# Patient Record
Sex: Female | Born: 1960 | Race: Black or African American | Hispanic: No | Marital: Single | Smoking: Never smoker
Health system: Southern US, Community
[De-identification: ages and names within clinical notes are randomized; demographics above are authoritative.]

## PROBLEM LIST (undated history)

## (undated) DIAGNOSIS — R569 Unspecified convulsions: Secondary | ICD-10-CM

## (undated) DIAGNOSIS — E785 Hyperlipidemia, unspecified: Secondary | ICD-10-CM

## (undated) DIAGNOSIS — E875 Hyperkalemia: Secondary | ICD-10-CM

## (undated) DIAGNOSIS — E559 Vitamin D deficiency, unspecified: Secondary | ICD-10-CM

## (undated) DIAGNOSIS — I10 Essential (primary) hypertension: Secondary | ICD-10-CM

## (undated) HISTORY — PX: COLONOSCOPY: SHX174

## (undated) HISTORY — DX: Vitamin D deficiency, unspecified: E55.9

## (undated) HISTORY — DX: Hyperkalemia: E87.5

## (undated) HISTORY — DX: Hyperlipidemia, unspecified: E78.5

---

## 2005-02-20 ENCOUNTER — Emergency Department (HOSPITAL_COMMUNITY): Admission: EM | Admit: 2005-02-20 | Discharge: 2005-02-20 | Payer: Self-pay | Admitting: Emergency Medicine

## 2014-11-19 ENCOUNTER — Encounter (HOSPITAL_BASED_OUTPATIENT_CLINIC_OR_DEPARTMENT_OTHER): Payer: Self-pay

## 2014-11-19 ENCOUNTER — Emergency Department (HOSPITAL_BASED_OUTPATIENT_CLINIC_OR_DEPARTMENT_OTHER)
Admission: EM | Admit: 2014-11-19 | Discharge: 2014-11-19 | Disposition: A | Discharge status: Home / Self Care | Payer: Worker's Compensation | Attending: Emergency Medicine | Admitting: Emergency Medicine

## 2014-11-19 DIAGNOSIS — Y9389 Activity, other specified: Secondary | ICD-10-CM | POA: Insufficient documentation

## 2014-11-19 DIAGNOSIS — M25512 Pain in left shoulder: Secondary | ICD-10-CM

## 2014-11-19 DIAGNOSIS — M779 Enthesopathy, unspecified: Secondary | ICD-10-CM | POA: Insufficient documentation

## 2014-11-19 DIAGNOSIS — S4992XA Unspecified injury of left shoulder and upper arm, initial encounter: Secondary | ICD-10-CM | POA: Diagnosis present

## 2014-11-19 DIAGNOSIS — Y9289 Other specified places as the place of occurrence of the external cause: Secondary | ICD-10-CM | POA: Diagnosis not present

## 2014-11-19 DIAGNOSIS — M778 Other enthesopathies, not elsewhere classified: Secondary | ICD-10-CM

## 2014-11-19 DIAGNOSIS — X58XXXA Exposure to other specified factors, initial encounter: Secondary | ICD-10-CM | POA: Insufficient documentation

## 2014-11-19 DIAGNOSIS — Y990 Civilian activity done for income or pay: Secondary | ICD-10-CM | POA: Diagnosis not present

## 2014-11-19 DIAGNOSIS — M7582 Other shoulder lesions, left shoulder: Secondary | ICD-10-CM

## 2014-11-19 DIAGNOSIS — S46912A Strain of unspecified muscle, fascia and tendon at shoulder and upper arm level, left arm, initial encounter: Secondary | ICD-10-CM | POA: Insufficient documentation

## 2014-11-19 MED ORDER — NAPROXEN 250 MG PO TABS
500.0000 mg | ORAL_TABLET | Freq: Once | ORAL | Status: AC
  Administered 2014-11-19: 500 mg via ORAL
  Filled 2014-11-19: qty 2

## 2014-11-19 MED ORDER — NAPROXEN 500 MG PO TABS: 500.0000 mg | ORAL_TABLET | Freq: Two times a day (BID) | ORAL | Status: DC | PRN

## 2014-11-19 NOTE — ED Provider Notes (Signed)
CSN: [id number]     Arrival date & time 11/19/14  1412 History   None    Chief Complaint  Patient presents with  . Shoulder Injury     (Consider location/radiation/quality/duration/timing/severity/associated sxs/prior Treatment) HPI Comments: Shari Gonzales is a 54 y.o. female who presents to the ED with complaints of left shoulder injury 3 days ago. Patient reports that she works at [employer] and she was pulling a wet sheet from the washing machine when she developed left shoulder pain. She describes her pain as 5/10 intermittent aching nonradiating pain worse with movement of the shoulder and relieved with applying pressure under her armpit. She denies any fevers, chills, chest pain, shortness of breath, abdominal pain, nausea, vomiting, dysuria, hematuria, numbness, tingling, weakness, swelling, bruises, or abrasions. She denies any prior injuries to the shoulder.  Patient is a 54 y.o. female presenting with shoulder injury. The history is provided by the patient. No language interpreter was used.  Shoulder Injury This is a new problem. The current episode started in the past 7 days. The problem occurs constantly. The problem has been unchanged. Associated symptoms include arthralgias (L shoulder). Pertinent negatives include no abdominal pain, chest pain, chills, fever, myalgias, nausea, numbness, vomiting or weakness. Exacerbated by: movement of shoulder. Treatments tried: holding pressure under armpit. The treatment provided mild relief.    History reviewed. No pertinent past medical history. History reviewed. No pertinent past surgical history. No family history on file. Social History  Substance Use Topics  . Smoking status: Never Smoker   . Smokeless tobacco: None  . Alcohol Use: No   OB History    No data available     Review of Systems  Constitutional: Negative for fever and chills.  Respiratory: Negative for shortness of breath.   Cardiovascular: Negative for chest pain.    Gastrointestinal: Negative for nausea, vomiting and abdominal pain.  Genitourinary: Negative for dysuria and hematuria.  Musculoskeletal: Positive for arthralgias (L shoulder). Negative for myalgias.  Skin: Negative for color change and wound.  Allergic/Immunologic: Negative for immunocompromised state.  Neurological: Negative for weakness and numbness.  Psychiatric/Behavioral: Negative for confusion.   10 Systems reviewed and are negative for acute change except as noted in the HPI.    Allergies  Review of patient's allergies indicates not on file.  Home Medications   Prior to Admission medications   Not on File   Triage VS: BP 184/101 mmHg  Pulse 76  Temp(Src) 97.9 F (36.6 C) (Oral)  Resp 18  Ht  (1.626 m)  Wt 166 lb 4.8 oz (75.433 kg)  BMI 28.53 kg/m2  SpO2 100% Exam VS: BP 143/90 mmHg  Pulse 75  Temp(Src) 97.9 F (36.6 C) (Oral)  Resp 18  Ht  (1.626 m)  Wt 166 lb 4.8 oz (75.433 kg)  BMI 28.53 kg/m2  SpO2 100%  Physical Exam  Constitutional: She is oriented to person, place, and time. Vital signs are normal. She appears well-developed and well-nourished.  Non-toxic appearance. No distress.  Afebrile, nontoxic, NAD  HENT:  Head: Normocephalic and atraumatic.  Mouth/Throat: Mucous membranes are normal.  Eyes: Conjunctivae and EOM are normal. Right eye exhibits no discharge. Left eye exhibits no discharge.  Neck: Normal range of motion. Neck supple.  Cardiovascular: Normal rate and intact distal pulses.   Pulmonary/Chest: Effort normal. No respiratory distress.  Abdominal: Normal appearance. She exhibits no distension.  Musculoskeletal:       Left shoulder: She exhibits decreased range of motion (due to  pain), tenderness and spasm. She exhibits no bony tenderness, no swelling, no effusion, no crepitus, no deformity, normal pulse and normal strength.       Arms: L shoulder with limited ROM due to pain, no bony TTP, with diffuse muscular TTP and spasms,  no swelling/effusion, no crepitus/deformity, +apley scratch, +pain with resisted int rotation, no pain with resisted ext rotation, +empty can test. Strength and sensation grossly intact in all extremities, distal pulses intact.    Neurological: She is alert and oriented to person, place, and time. She has normal strength. No sensory deficit.  Skin: Skin is warm, dry and intact. No rash noted.  Psychiatric: She has a normal mood and affect. Her behavior is normal.  Nursing note and vitals reviewed.   ED Course  Procedures (including critical care time) Labs Review Labs Reviewed - No data to display  Imaging Review No results found. I have personally reviewed and evaluated these images and lab results as part of my medical decision-making.   EKG Interpretation None      MDM   Final diagnoses:  Left shoulder strain, initial encounter  Left shoulder pain  Shoulder tendinitis, left    54 y.o. female here with L shoulder strain after pulling wet sheet from washing machine last weekend. All extremities NVI with soft compartments, L shoulder with tenderness along muscles of posterior shoulder, no focal bony tenderness. Doubt need for xray imaging, likely rotator cuff tendinitis/strain. Will give sling, discussed proper use for no more than 3 days, then ROM exercises thereafter. Will give naprosyn. Discussed heat/ice/tylenol use. Will have her f/up with ortho in 1-2wks. I explained the diagnosis and have given explicit precautions to return to the ER including for any other new or worsening symptoms. The patient understands and accepts the medical plan as it's been dictated and I have answered their questions. Discharge instructions concerning home care and prescriptions have been given. The patient is STABLE and is discharged to home in good condition.  BP 143/90 mmHg  Pulse 75  Temp(Src) 97.9 F (36.6 C) (Oral)  Resp 18  Ht 5\' 4"  (1.626 m)  Wt 166 lb 4.8 oz (75.433 kg)  BMI 28.53 kg/m2   SpO2 100%  Meds ordered this encounter  Medications  . naproxen (NAPROSYN) tablet 500 mg    Sig:   . naproxen (NAPROSYN) 500 MG tablet    Sig: Take 1 tablet (500 mg total) by mouth 2 (two) times daily as needed for mild pain, moderate pain or headache (TAKE WITH MEALS.).    Dispense:  20 tablet    Refill:  0    Order Specific Question:  Supervising Provider    Answer:  Eber HongMILLER, BRIAN [[id number]]       Manjot Hinks Camprubi-Soms, PA-C 11/19/14 1523  Mirian MoMatthew Gentry, MD 11/22/14 1012

## 2014-11-19 NOTE — Discharge Instructions (Signed)
Wear shoulder sling for no more than 3 days, then begin performing gentle range of motion exercises. Use heat on your shoulder throughout the day, using a heat pack for 20 minutes at a time every hour. Alternate between naprosyn and tylenol for pain. Call orthopedic follow up today or tomorrow to schedule followup appointment for recheck of ongoing shoulder pain in 1-2 weeks that can be canceled with a 24-48 hour notice if complete resolution of pain. Return to the ER for changes or worsening symptoms.    Shoulder Pain The shoulder is the joint that connects your arm to your body. Muscles and band-like tissues that connect bones to muscles (tendons) hold the joint together. Shoulder pain is felt if an injury or medical problem affects one or more parts of the shoulder. HOME CARE   Put ice on the sore area.  Put ice in a plastic bag.  Place a towel between your skin and the bag.  Leave the ice on for 15-20 minutes, 03-04 times a day for the first 2 days.  Stop using cold packs if they do not help with the pain.  If you were given something to keep your shoulder from moving (sling; shoulder immobilizer), wear it as told. Only take it off to shower or bathe.  Move your arm as little as possible, but keep your hand moving to prevent puffiness (swelling).  Squeeze a soft ball or foam pad as much as possible to help prevent swelling.  Take medicine as told by your doctor. GET HELP IF:  You have progressing new pain in your arm, hand, or fingers.  Your hand or fingers get cold.  Your medicine does not help lessen your pain. GET HELP RIGHT AWAY IF:   Your arm, hand, or fingers are numb or tingling.  Your arm, hand, or fingers are puffy (swollen), painful, or turn white or blue. MAKE SURE YOU:   Understand these instructions.  Will watch your condition.  Will get help right away if you are not doing well or get worse.   This information is not intended to replace advice given to  you by your health care provider. Make sure you discuss any questions you have with your health care provider.   Document Released: 06/23/2007 Document Revised: 01/25/2014 Document Reviewed: 04/29/2014 Elsevier Interactive Patient Education 2016 Elsevier Inc.  Shoulder Range of Motion Exercises Shoulder range of motion (ROM) exercises are designed to keep the shoulder moving freely. They are often recommended for people who have shoulder pain. MOVEMENT EXERCISE When you are able, do this exercise 5-6 days per week, or as told by your health care provider. Work toward doing 2 sets of 10 swings. Pendulum Exercise How To Do This Exercise Lying Down  Lie face-down on a bed with your abdomen close to the side of the bed.  Let your arm hang over the side of the bed.  Relax your shoulder, arm, and hand.  Slowly and gently swing your arm forward and back. Do not use your neck muscles to swing your arm. They should be relaxed. If you are struggling to swing your arm, have someone gently swing it for you. When you do this exercise for the first time, swing your arm at a 15 degree angle for 15 seconds, or swing your arm 10 times. As pain lessens over time, increase the angle of the swing to 30-45 degrees.  Repeat steps 1-4 with the other arm. How To Do This Exercise While Standing  Stand next  to a sturdy chair or table and hold on to it with your hand.  Bend forward at the waist.  Bend your knees slightly.  Relax your other arm and let it hang limp.  Relax the shoulder blade of the arm that is hanging and let it drop.  While keeping your shoulder relaxed, use body motion to swing your arm in small circles. The first time you do this exercise, swing your arm for about 30 seconds or 10 times. When you do it next time, swing your arm for a little longer.  Stand up tall and relax.  Repeat steps 1-7, this time changing the direction of the circles.  Repeat steps 1-8 with the other  arm. STRETCHING EXERCISES Do these exercises 3-4 times per day on 5-6 days per week or as told by your health care provider. Work toward holding the stretch for 20 seconds. Stretching Exercise 1  Lift your arm straight out in front of you.  Bend your arm 90 degrees at the elbow (right angle) so your forearm goes across your body and looks like the letter "L."  Use your other arm to gently pull the elbow forward and across your body.  Repeat steps 1-3 with the other arm. Stretching Exercise 2 You will need a towel or rope for this exercise.  Bend one arm behind your back with the palm facing outward.  Hold a towel with your other hand.  Reach the arm that holds the towel above your head, and bend that arm at the elbow. Your wrist should be behind your neck.  Use your free hand to grab the free end of the towel.  With the higher hand, gently pull the towel up behind you.  With the lower hand, pull the towel down behind you.  Repeat steps 1-6 with the other arm. STRENGTHENING EXERCISES Do each of these exercises at four different times of day (sessions) every day or as told by your health care provider. To begin with, repeat each exercise 5 times (repetitions). Work toward doing 3 sets of 12 repetitions or as told by your health care provider. Strengthening Exercise 1 You will need a light weight for this activity. As you grow stronger, you may use a heavier weight. 1. Standing with a weight in your hand, lift your arm straight out to the side until it is at the same height as your shoulder. 2. Bend your arm at 90 degrees so that your fingers are pointing to the ceiling. 3. Slowly raise your hand until your arm is straight up in the air. 4. Repeat steps 1-3 with the other arm. Strengthening Exercise 2 You will need a light weight for this activity. As you grow stronger, you may use a heavier weight. 1. Standing with a weight in your hand, gradually move your straight arm in an arc,  starting at your side, then out in front of you, then straight up over your head. 2. Gradually move your other arm in an arc, starting at your side, then out in front of you, then straight up over your head. 3. Repeat steps 1-2 with the other arm. Strengthening Exercise 3 You will need an elastic band for this activity. As you grow stronger, gradually increase the size of the bands or increase the number of bands that you use at one time. 1. While standing, hold an elastic band in one hand and raise that arm up in the air. 2. With your other hand, pull down the band  until that hand is by your side. 3. Repeat steps 1-2 with the other arm.   This information is not intended to replace advice given to you by your health care provider. Make sure you discuss any questions you have with your health care provider.   Document Released: 10/03/2002 Document Revised: 05/21/2014 Document Reviewed: 12/31/2013 Elsevier Interactive Patient Education 2016 Elsevier Inc.  Foot LockerHeat Therapy Heat therapy can help ease sore, stiff, injured, and tight muscles and joints. Heat relaxes your muscles, which may help ease your pain. Heat therapy should only be used on old, pre-existing, or long-lasting (chronic) injuries. Do not use heat therapy unless told by your doctor. HOW TO USE HEAT THERAPY There are several different kinds of heat therapy, including:  Moist heat pack.  Warm water bath.  Hot water bottle.  Electric heating pad.  Heated gel pack.  Heated wrap.  Electric heating pad. GENERAL HEAT THERAPY RECOMMENDATIONS   Do not sleep while using heat therapy. Only use heat therapy while you are awake.  Your skin may turn pink while using heat therapy. Do not use heat therapy if your skin turns red.  Do not use heat therapy if you have new pain.  High heat or long exposure to heat can cause burns. Be careful when using heat therapy to avoid burning your skin.  Do not use heat therapy on areas of your  skin that are already irritated, such as with a rash or sunburn. GET HELP IF:   You have blisters, redness, swelling (puffiness), or numbness.  You have new pain.  Your pain is worse. MAKE SURE YOU:  Understand these instructions.  Will watch your condition.  Will get help right away if you are not doing well or get worse.   This information is not intended to replace advice given to you by your health care provider. Make sure you discuss any questions you have with your health care provider.   Document Released: 03/29/2011 Document Revised: 01/25/2014 Document Reviewed: 02/27/2013 Elsevier Interactive Patient Education [employer] Elsevier Inc.

## 2014-11-19 NOTE — ED Notes (Signed)
Pt states she injured her left shoulder this weekend at work pulling sheets out of washer-NAD-steady gait

## 2014-11-25 ENCOUNTER — Encounter: Payer: Self-pay | Admitting: Family Medicine

## 2014-11-25 ENCOUNTER — Ambulatory Visit (INDEPENDENT_AMBULATORY_CARE_PROVIDER_SITE_OTHER): Payer: Worker's Compensation | Admitting: Family Medicine

## 2014-11-25 VITALS — BP 156/102 | HR 59 | Ht 64.0 in | Wt 163.8 lb

## 2014-11-25 DIAGNOSIS — S4992XA Unspecified injury of left shoulder and upper arm, initial encounter: Secondary | ICD-10-CM

## 2014-11-25 DIAGNOSIS — M25512 Pain in left shoulder: Secondary | ICD-10-CM

## 2014-11-25 MED ORDER — NAPROXEN 500 MG PO TABS: 500.0000 mg | ORAL_TABLET | Freq: Two times a day (BID) | ORAL | Status: DC | PRN

## 2014-11-25 NOTE — Patient Instructions (Signed)
You have strained muscles of your back and shoulder (primarily serratus and latissimus). Start physical therapy and home exercises. Heat 15 minutes at a time 3-4 times a day. Naproxen 500mg  twice a day with food for a few more days then as needed. Use sling only as needed at this point. Restrictions at work as Barrister's clerkwritten. Follow up with me in 5-6 weeks for reevaluation.

## 2014-11-26 DIAGNOSIS — S4992XA Unspecified injury of left shoulder and upper arm, initial encounter: Secondary | ICD-10-CM | POA: Insufficient documentation

## 2014-11-26 NOTE — Assessment & Plan Note (Signed)
consistent with strain of serratus anterior and latissimus dorsi muscles.  Should do well with physical therapy and home exercises.  Heat, naproxen.  Restrictions written for work (see letter).  F/u in 5-6 weeks.

## 2014-11-26 NOTE — Progress Notes (Signed)
PCP: No PCP Per Patient  Subjective:   HPI: Patient is a 54 y.o. female here for left shoulder injury.  Patient reports she was at work on 11/1. 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 Is right handed. Pain progressed, sharp, worse with movements of trunk (rotation). And pulling motion. Pain level 5/10. Difficulty sleeping on left side. No radiation. No numbness/tingling. No skin changes, fever, other complaints.  No past medical history on file.  No current outpatient prescriptions on file prior to visit.   No current facility-administered medications on file prior to visit.    No past surgical history on file.  No Known Allergies  Social History   Social History  . Marital Status: Single    Spouse Name: N/A  . Number of Children: N/A  . Years of Education: N/A   Occupational History  . Not on file.   Social History Main Topics  . Smoking status: Never Smoker   . Smokeless tobacco: Not on file  . Alcohol Use: No  . Drug Use: No  . Sexual Activity: Not on file   Other Topics Concern  . Not on file   Social History Narrative    No family history on file.  BP 156/102 mmHg  Pulse 59  Ht 5\' 4"  (1.626 m)  Wt 163 lb 12.8 oz (74.299 kg)  BMI 28.10 kg/m2  Review of Systems: See HPI above.    Objective:  Physical Exam:  Gen: NAD  Left shoulder: No swelling, ecchymoses.  No gross deformity. TTP posterior shoulder over latissimus and serratus laterally on rib cage.  No other tenderness. FROM. Negative Hawkins, Neers. Negative Speeds, Yergasons. Strength 5/5 with empty can and resisted internal/external rotation. Negative apprehension. NV intact distally.    Right shoulder: FROM without pain.  Assessment & Plan:  1. Left shoulder injury - consistent with strain of serratus anterior and latissimus dorsi muscles.  Should do well with physical therapy and home  exercises.  Heat, naproxen.  Restrictions written for work (see letter).  F/u in 5-6 weeks.

## 2014-12-09 ENCOUNTER — Ambulatory Visit (HOSPITAL_BASED_OUTPATIENT_CLINIC_OR_DEPARTMENT_OTHER)
Admission: RE | Admit: 2014-12-09 | Discharge: 2014-12-09 | Disposition: A | Discharge status: Home / Self Care | Payer: Worker's Compensation | Source: Ambulatory Visit | Attending: Family Medicine | Admitting: Family Medicine

## 2014-12-09 ENCOUNTER — Telehealth: Payer: Self-pay | Admitting: Family Medicine

## 2014-12-09 ENCOUNTER — Encounter: Payer: Self-pay | Admitting: Family Medicine

## 2014-12-09 ENCOUNTER — Ambulatory Visit (INDEPENDENT_AMBULATORY_CARE_PROVIDER_SITE_OTHER): Payer: Worker's Compensation | Admitting: Family Medicine

## 2014-12-09 VITALS — BP 159/98 | HR 69 | Ht 64.0 in | Wt 165.4 lb

## 2014-12-09 DIAGNOSIS — S4992XD Unspecified injury of left shoulder and upper arm, subsequent encounter: Secondary | ICD-10-CM | POA: Diagnosis not present

## 2014-12-09 DIAGNOSIS — R079 Chest pain, unspecified: Secondary | ICD-10-CM | POA: Diagnosis not present

## 2014-12-09 DIAGNOSIS — R0781 Pleurodynia: Secondary | ICD-10-CM | POA: Insufficient documentation

## 2014-12-09 MED ORDER — HYDROCODONE-ACETAMINOPHEN 5-325 MG PO TABS: 1.0000 | ORAL_TABLET | Freq: Four times a day (QID) | ORAL | Status: DC | PRN

## 2014-12-09 MED ORDER — METHOCARBAMOL 500 MG PO TABS: 500.0000 mg | ORAL_TABLET | Freq: Three times a day (TID) | ORAL | Status: DC | PRN

## 2014-12-09 NOTE — Patient Instructions (Signed)
You have strained muscles of your back and shoulder (primarily serratus and latissimus). Your x-rays are very reassuring. Start physical therapy and home exercises. Heat 15 minutes at a time 3-4 times a day. Naproxen 500mg  twice a day with food. Robaxin as needed for spasms. Consider filling the norco as well for severe pain. Restrictions at work as written for another 4 weeks (your initial note has this down). Follow up with me in 4 weeks for reevaluation.

## 2014-12-09 NOTE — Telephone Encounter (Signed)
Finished, made appt.

## 2014-12-16 NOTE — Assessment & Plan Note (Signed)
consistent with strain of serratus anterior and latissimus dorsi muscles.  Radiographs of ribs, chest negative.  Continue heat, naproxen.  Robaxin for spasms.  Start physical therapy and home exercises.  Continue current restrictions.  F/u in 4 weeks.

## 2014-12-16 NOTE — Progress Notes (Signed)
PCP: No PCP Per Patient  Subjective:   HPI: Patient is a 54 y.o. female here for left shoulder injury.  11/7: Patient reports she was at work on 11/1. 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 Is right handed. Pain progressed, sharp, worse with movements of trunk (rotation). And pulling motion. Pain level 5/10. Difficulty sleeping on left side. No radiation. No numbness/tingling. No skin changes, fever, other complaints.  11/21: Patient reports she still has 5/10 level of pain, sharp. Worse with trunk rotation. No real changes from visit 2 weeks ago. Has not started physical therapy. Pain lateral left shoulder, rib cage. Used heat, naproxen. No skin changes, fever, other complaints.  No past medical history on file.  Current Outpatient Prescriptions on File Prior to Visit  Medication Sig Dispense Refill  . naproxen (NAPROSYN) 500 MG tablet Take 1 tablet (500 mg total) by mouth 2 (two) times daily as needed for mild pain, moderate pain or headache (TAKE WITH MEALS.). 60 tablet 1   No current facility-administered medications on file prior to visit.    No past surgical history on file.  No Known Allergies  Social History   Social History  . Marital Status: Single    Spouse Name: N/A  . Number of Children: N/A  . Years of Education: N/A   Occupational History  . Not on file.   Social History Main Topics  . Smoking status: Never Smoker   . Smokeless tobacco: Not on file  . Alcohol Use: No  . Drug Use: No  . Sexual Activity: Not on file   Other Topics Concern  . Not on file   Social History Narrative    No family history on file.  BP 159/98 mmHg  Pulse 69  Ht 5\' 4"  (1.626 m)  Wt 165 lb 6.4 oz (75.025 kg)  BMI 28.38 kg/m2  Review of Systems: See HPI above.    Objective:  Physical Exam:  Gen: NAD  Left shoulder: No swelling, ecchymoses.  No gross deformity. TTP  posterior shoulder over latissimus and serratus laterally on rib cage.  No other tenderness. FROM. Negative Hawkins, Neers. Negative Speeds, Yergasons. Strength 5/5 with empty can and resisted internal/external rotation. Negative apprehension. NV intact distally.    Right shoulder: FROM without pain.  Assessment & Plan:  1. Left shoulder injury - consistent with strain of serratus anterior and latissimus dorsi muscles.  Radiographs of ribs, chest negative.  Continue heat, naproxen.  Robaxin for spasms.  Start physical therapy and home exercises.  Continue current restrictions.  F/u in 4 weeks.

## 2014-12-17 ENCOUNTER — Ambulatory Visit: Payer: Worker's Compensation

## 2014-12-17 ENCOUNTER — Telehealth: Payer: Self-pay | Admitting: Family Medicine

## 2014-12-30 ENCOUNTER — Ambulatory Visit: Payer: Self-pay | Admitting: Family Medicine

## 2014-12-30 ENCOUNTER — Ambulatory Visit (INDEPENDENT_AMBULATORY_CARE_PROVIDER_SITE_OTHER): Payer: Worker's Compensation | Admitting: Family Medicine

## 2014-12-30 ENCOUNTER — Encounter: Payer: Self-pay | Admitting: Family Medicine

## 2014-12-30 VITALS — BP 148/101 | HR 62 | Ht 64.0 in

## 2014-12-30 DIAGNOSIS — S4992XD Unspecified injury of left shoulder and upper arm, subsequent encounter: Secondary | ICD-10-CM

## 2014-12-30 NOTE — Patient Instructions (Signed)
You have strained muscles of your back and shoulder (primarily serratus and latissimus). Continue  physical therapy and do home exercises on days you don't go to therapy. Heat 15 minutes at a time 3-4 times a day. Naproxen 500mg  twice a day with food. Robaxin as needed for spasms. Restrictions at work as written for another 4 weeks. Follow up with me in 4 weeks for reevaluation. Plan to work you back toward full duty at follow-up.

## 2015-01-02 NOTE — Progress Notes (Signed)
PCP: No PCP Per Patient  Subjective:   HPI: Patient is a 54 y.o. female here for left shoulder injury.  11/7: Patient reports she was at work on 11/1. 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 Is right handed. Pain progressed, sharp, worse with movements of trunk (rotation). And pulling motion. Pain level 5/10. Difficulty sleeping on left side. No radiation. No numbness/tingling. No skin changes, fever, other complaints.  11/21: Patient reports she still has 5/10 level of pain, sharp. Worse with trunk rotation. No real changes from visit 2 weeks ago. Has not started physical therapy. Pain lateral left shoulder, rib cage. Used heat, naproxen. No skin changes, fever, other complaints.  12/12: Patient just started PT so no real changes from last visit. Pain level 5/10. Worse with rotation. No skin changes, fever, other complaints.  No past medical history on file.  Current Outpatient Prescriptions on File Prior to Visit  Medication Sig Dispense Refill  . HYDROcodone-acetaminophen (NORCO) 5-325 MG tablet Take 1 tablet by mouth every 6 (six) hours as needed for moderate pain. 20 tablet 0  . methocarbamol (ROBAXIN) 500 MG tablet Take 1 tablet (500 mg total) by mouth every 8 (eight) hours as needed for muscle spasms. 60 tablet 1  . naproxen (NAPROSYN) 500 MG tablet Take 1 tablet (500 mg total) by mouth 2 (two) times daily as needed for mild pain, moderate pain or headache (TAKE WITH MEALS.). 60 tablet 1   No current facility-administered medications on file prior to visit.    No past surgical history on file.  No Known Allergies  Social History   Social History  . Marital Status: Single    Spouse Name: N/A  . Number of Children: N/A  . Years of Education: N/A   Occupational History  . Not on file.   Social History Main Topics  . Smoking status: Never Smoker   . Smokeless tobacco:  Not on file  . Alcohol Use: No  . Drug Use: No  . Sexual Activity: Not on file   Other Topics Concern  . Not on file   Social History Narrative    No family history on file.  BP 148/101 mmHg  Pulse 62  Ht 5\' 4"  (1.626 m)  Review of Systems: See HPI above.    Objective:  Physical Exam:  Gen: NAD  Left shoulder: No swelling, ecchymoses.  No gross deformity. TTP posterior shoulder over latissimus and serratus laterally on rib cage.  No other tenderness. FROM. Negative Hawkins, Neers. Negative Speeds, Yergasons. Strength 5/5 with empty can and resisted internal/external rotation. Negative apprehension. NV intact distally.    Right shoulder: FROM without pain.  Assessment & Plan:  1. Left shoulder injury - consistent with strain of serratus anterior and latissimus dorsi muscles.  No changes from last visit as has only done one visit of PT to date.  Continue with this and home exercises, f/u in 4 weeks.  Hopefully can start transitioning her back toward full duty.  Heat, naproxen with robaxin for spasms.

## 2015-01-02 NOTE — Assessment & Plan Note (Signed)
consistent with strain of serratus anterior and latissimus dorsi muscles.  No changes from last visit as has only done one visit of PT to date.  Continue with this and home exercises, f/u in 4 weeks.  Hopefully can start transitioning her back toward full duty.  Heat, naproxen with robaxin for spasms.

## 2015-01-27 ENCOUNTER — Encounter: Payer: Self-pay | Admitting: Family Medicine

## 2015-01-27 ENCOUNTER — Ambulatory Visit (INDEPENDENT_AMBULATORY_CARE_PROVIDER_SITE_OTHER): Payer: Worker's Compensation | Admitting: Family Medicine

## 2015-01-27 ENCOUNTER — Ambulatory Visit: Payer: Worker's Compensation | Admitting: Family Medicine

## 2015-01-27 VITALS — BP 138/90 | HR 61 | Ht 64.0 in | Wt 165.0 lb

## 2015-01-27 DIAGNOSIS — S4992XD Unspecified injury of left shoulder and upper arm, subsequent encounter: Secondary | ICD-10-CM

## 2015-01-27 NOTE — Progress Notes (Signed)
PCP: No PCP Per Patient  Subjective:   HPI: Patient is a 55 y.o. female here for left shoulder injury.  11/7: Patient reports she was at work on 11/1. 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 Is right handed. Pain progressed, sharp, worse with movements of trunk (rotation). And pulling motion. Pain level 5/10. Difficulty sleeping on left side. No radiation. No numbness/tingling. No skin changes, fever, other complaints.  11/21: Patient reports she still has 5/10 level of pain, sharp. Worse with trunk rotation. No real changes from visit 2 weeks ago. Has not started physical therapy. Pain lateral left shoulder, rib cage. Used heat, naproxen. No skin changes, fever, other complaints.  12/12: Patient just started PT so no real changes from last visit. Pain level 5/10. Worse with rotation. No skin changes, fever, other complaints.  01/27/15: Patient reports she feels completely better. Did well with physical therapy and home exercises. Motion is good. Some soreness to push on the area. No skin changes, fever.  No past medical history on file.  Current Outpatient Prescriptions on File Prior to Visit  Medication Sig Dispense Refill  . HYDROcodone-acetaminophen (NORCO) 5-325 MG tablet Take 1 tablet by mouth every 6 (six) hours as needed for moderate pain. 20 tablet 0  . methocarbamol (ROBAXIN) 500 MG tablet Take 1 tablet (500 mg total) by mouth every 8 (eight) hours as needed for muscle spasms. 60 tablet 1  . naproxen (NAPROSYN) 500 MG tablet Take 1 tablet (500 mg total) by mouth 2 (two) times daily as needed for mild pain, moderate pain or headache (TAKE WITH MEALS.). 60 tablet 1   No current facility-administered medications on file prior to visit.    No past surgical history on file.  No Known Allergies  Social History   Social History  . Marital Status: Single    Spouse Name: N/A   . Number of Children: N/A  . Years of Education: N/A   Occupational History  . Not on file.   Social History Main Topics  . Smoking status: Never Smoker   . Smokeless tobacco: Not on file  . Alcohol Use: No  . Drug Use: No  . Sexual Activity: Not on file   Other Topics Concern  . Not on file   Social History Narrative    No family history on file.  BP 138/90 mmHg  Pulse 61  Ht 5\' 4"  (1.626 m)  Wt 165 lb (74.844 kg)  BMI 28.31 kg/m2  Review of Systems: See HPI above.    Objective:  Physical Exam:  Gen: NAD  Left shoulder: No swelling, ecchymoses.  No gross deformity. Minimal TTP posterior shoulder over latissimus and serratus laterally on rib cage.  No other tenderness. FROM. Negative Hawkins, Neers. Negative Speeds, Yergasons. Strength 5/5 with empty can and resisted internal/external rotation. Negative apprehension. NV intact distally.    Right shoulder: FROM without pain.  Assessment & Plan:  1. Left shoulder injury - consistent with strain of serratus anterior and latissimus dorsi muscles.  Significant improvement with PT and home exercises.  Encouraged to do home exercises for another 6 weeks.  Return to full duty at work.  F/u prn.

## 2015-01-28 NOTE — Assessment & Plan Note (Signed)
consistent with strain of serratus anterior and latissimus dorsi muscles.  Significant improvement with PT and home exercises.  Encouraged to do home exercises for another 6 weeks.  Return to full duty at work.  F/u prn.

## 2015-02-26 ENCOUNTER — Telehealth: Payer: Self-pay | Admitting: Family Medicine

## 2015-02-26 NOTE — Telephone Encounter (Signed)
Form in chart

## 2015-02-27 NOTE — Telephone Encounter (Signed)
Finished

## 2015-05-12 ENCOUNTER — Encounter (HOSPITAL_COMMUNITY): Payer: Self-pay | Admitting: Emergency Medicine

## 2015-05-12 ENCOUNTER — Emergency Department (HOSPITAL_COMMUNITY)
Admission: EM | Admit: 2015-05-12 | Discharge: 2015-05-13 | Disposition: A | Discharge status: Home / Self Care | Payer: BLUE CROSS/BLUE SHIELD | Attending: Emergency Medicine | Admitting: Emergency Medicine

## 2015-05-12 DIAGNOSIS — S20212A Contusion of left front wall of thorax, initial encounter: Secondary | ICD-10-CM | POA: Insufficient documentation

## 2015-05-12 DIAGNOSIS — Y92009 Unspecified place in unspecified non-institutional (private) residence as the place of occurrence of the external cause: Secondary | ICD-10-CM | POA: Insufficient documentation

## 2015-05-12 DIAGNOSIS — S8001XA Contusion of right knee, initial encounter: Secondary | ICD-10-CM | POA: Diagnosis not present

## 2015-05-12 DIAGNOSIS — W19XXXA Unspecified fall, initial encounter: Secondary | ICD-10-CM

## 2015-05-12 DIAGNOSIS — S199XXA Unspecified injury of neck, initial encounter: Secondary | ICD-10-CM | POA: Insufficient documentation

## 2015-05-12 DIAGNOSIS — S29002A Unspecified injury of muscle and tendon of back wall of thorax, initial encounter: Secondary | ICD-10-CM | POA: Insufficient documentation

## 2015-05-12 DIAGNOSIS — Y998 Other external cause status: Secondary | ICD-10-CM | POA: Insufficient documentation

## 2015-05-12 DIAGNOSIS — I10 Essential (primary) hypertension: Secondary | ICD-10-CM | POA: Diagnosis not present

## 2015-05-12 DIAGNOSIS — R569 Unspecified convulsions: Secondary | ICD-10-CM | POA: Diagnosis not present

## 2015-05-12 DIAGNOSIS — H6123 Impacted cerumen, bilateral: Secondary | ICD-10-CM | POA: Diagnosis not present

## 2015-05-12 DIAGNOSIS — M549 Dorsalgia, unspecified: Secondary | ICD-10-CM | POA: Diagnosis not present

## 2015-05-12 DIAGNOSIS — T148 Other injury of unspecified body region: Secondary | ICD-10-CM | POA: Diagnosis not present

## 2015-05-12 DIAGNOSIS — S3991XA Unspecified injury of abdomen, initial encounter: Secondary | ICD-10-CM | POA: Diagnosis not present

## 2015-05-12 DIAGNOSIS — W108XXA Fall (on) (from) other stairs and steps, initial encounter: Secondary | ICD-10-CM | POA: Diagnosis not present

## 2015-05-12 DIAGNOSIS — Y9389 Activity, other specified: Secondary | ICD-10-CM | POA: Diagnosis not present

## 2015-05-12 DIAGNOSIS — M546 Pain in thoracic spine: Secondary | ICD-10-CM | POA: Diagnosis not present

## 2015-05-12 HISTORY — DX: Unspecified convulsions: R56.9

## 2015-05-12 HISTORY — DX: Essential (primary) hypertension: I10

## 2015-05-12 LAB — CBG MONITORING, ED: Glucose-Capillary: 73 mg/dL (ref 65–99)

## 2015-05-12 NOTE — ED Notes (Signed)
[facility] Expected date:  Expected time:  Means of arrival:  Comments: EMS 54yo F fall down 2 steps / back pain / spinal precautions

## 2015-05-12 NOTE — ED Notes (Signed)
Pt presents from home after falling down 2 stairs. Pt admits to drinking. States she tripped and fell and covered her face with her hands so she didn't hit her head. Pt is not oriented to day or president. Seizure hx but pt does recall events. C/O mid back pain. Alert.

## 2015-05-12 NOTE — ED Provider Notes (Signed)
CSN: [id number]     Arrival date & time 05/12/15  2238 History  By signing my name below, I, Shari Gonzales, attest that this documentation has been prepared under the direction and in the presence of Laurence Spates, MD. Electronically Signed: Budd Gonzales, ED Scribe. 05/13/2015. 12:00 AM.    Chief Complaint  Patient presents with  . Fall  . Seizures   The history is provided by the patient, a relative and a significant other. No language interpreter was used.   HPI Comments: Shari Gonzales is a 55 y.o. female with a PMHx of HTN and seizures who presents to the Emergency Department complaining of a fall and seizure that occurred just PTA. Pt states she was going down the stairs when she tripped and fell down 3 steps face-first. She reports she covered her face with her hands and did not strike her head. She notes she remembers the entire incident, but notes she had a seizure immediately afterwards. She states she knew she had a seizure, as she typically sticks her tongue out just prior to seizing. No tongue biting, incontinence, or post-ictal state. She did not walk after the incident and notes her boyfriend and sister lifted her up and called EMS, who then transferred her to the ambulance. She reports associated mid-back and neck pain. She notes she does not take anti-seizure medication and doesn't follow w/ neurologist for this. Per daughter, pt is on blood pressure medication, but is not taking any blood thinners. Per boyfriend, pt had pulled a shoulder muscle, for which she has been written out of work for a month.Pt denies numbness.   Past Medical History  Diagnosis Date  . Seizures (HCC)   . Hypertension    No past surgical history on file. No family history on file. Social History  Substance Use Topics  . Smoking status: Never Smoker   . Smokeless tobacco: None  . Alcohol Use: No   OB History    No data available     Review of Systems 10 Systems reviewed and all are  negative for acute change except as noted in the HPI.   Allergies  Review of patient's allergies indicates no known allergies.  Home Medications   Prior to Admission medications   Medication Sig Start Date End Date Taking? Authorizing Provider  HYDROcodone-acetaminophen (NORCO) 5-325 MG tablet Take 1 tablet by mouth every 6 (six) hours as needed for moderate pain. Patient not taking: Reported on 05/13/2015 12/09/14   Lenda Kelp, MD  methocarbamol (ROBAXIN) 500 MG tablet Take 1 tablet (500 mg total) by mouth every 8 (eight) hours as needed for muscle spasms. Patient not taking: Reported on 05/13/2015 12/09/14   Lenda Kelp, MD  naproxen (NAPROSYN) 500 MG tablet Take 1 tablet (500 mg total) by mouth 2 (two) times daily as needed for mild pain, moderate pain or headache (TAKE WITH MEALS.). Patient not taking: Reported on 05/13/2015 11/25/14   Lenda Kelp, MD   BP 118/85 mmHg  Pulse 73  Temp(Src) 98 F (36.7 C) (Oral)  Resp 18  SpO2 98% Physical Exam  Constitutional: She is oriented to person, place, and time. She appears well-developed and well-nourished. No distress.  HENT:  Head: Normocephalic and atraumatic.  Moist mucous membranes; b/l cerumen impaction  Eyes: Conjunctivae are normal. Pupils are equal, round, and reactive to light.  Neck: No tracheal deviation present.  In C-collar  Cardiovascular: Normal rate, regular rhythm and normal heart sounds.   No murmur heard.  Pulmonary/Chest: Effort normal and breath sounds normal.  Abdominal: Soft. Bowel sounds are normal. She exhibits no distension. There is tenderness (L and RLQ). There is no rebound and no guarding.  Musculoskeletal: She exhibits tenderness. She exhibits no edema.  L lower thoracic paraspinal muscle TTP  Neurological: She is alert and oriented to person, place, and time. She has normal reflexes. No cranial nerve deficit.  Fluent speech, 5/5 strength and normal sensation in all 4 extremities  Skin: Skin is  warm and dry.  Small contusion just below the R knee, contusion to the left side just below costo-phrenic angle  Psychiatric: She has a normal mood and affect. Judgment normal.  Nursing note and vitals reviewed.   ED Course  Procedures  DIAGNOSTIC STUDIES: Oxygen Saturation is 99% on RA, normal by my interpretation.    COORDINATION OF CARE: 11:55 PM - Discussed plans to order diagnostic imaging. Pt advised of plan for treatment and pt agrees.  Labs Review Labs Reviewed  CBC WITH DIFFERENTIAL/PLATELET - Abnormal; Notable for the following:    MCHC 36.4 (*)    All other components within normal limits  CBG MONITORING, ED  I-STAT CHEM 8, ED    Imaging Review Dg Chest 2 View  05/13/2015  CLINICAL DATA:  Acute onset of seizure and fall. High blood pressure. Diffuse chest soreness. Initial encounter. EXAM: CHEST  2 VIEW COMPARISON:  Chest radiograph performed 12/09/2014 FINDINGS: The lungs are well-aerated and clear. There is no evidence of focal opacification, pleural effusion or pneumothorax. The heart is borderline enlarged. No acute osseous abnormalities are seen. IMPRESSION: Borderline cardiomegaly. Lungs remain grossly clear. No displaced rib fracture seen. Electronically Signed   By: Roanna Raider M.D.   On: 05/13/2015 01:56   Ct Head Wo Contrast  05/13/2015  CLINICAL DATA:  Status post fall down 3 steps. Status post seizure, with concern for head or cervical spine injury. Initial encounter. EXAM: CT HEAD WITHOUT CONTRAST CT CERVICAL SPINE WITHOUT CONTRAST TECHNIQUE: Multidetector CT imaging of the head and cervical spine was performed following the standard protocol without intravenous contrast. Multiplanar CT image reconstructions of the cervical spine were also generated. COMPARISON:  None. FINDINGS: CT HEAD FINDINGS There is no evidence of acute infarction, mass lesion, or intra- or extra-axial hemorrhage on CT. Mild periventricular and subcortical white matter change likely  reflects small vessel ischemic microangiopathy. The posterior fossa, including the cerebellum, brainstem and fourth ventricle, is within normal limits. The third and lateral ventricles, and basal ganglia are unremarkable in appearance. The cerebral hemispheres are symmetric in appearance, with normal gray-white differentiation. No mass effect or midline shift is seen. There is no evidence of fracture; visualized osseous structures are unremarkable in appearance. The orbits are within normal limits. The paranasal sinuses and mastoid air cells are well-aerated. No significant soft tissue abnormalities are seen. CT CERVICAL SPINE FINDINGS There is no evidence of fracture or subluxation. Vertebral bodies demonstrate normal height and alignment. Intervertebral disc spaces are preserved. There is mild reversal of the normal lordotic curvature of the cervical spine, likely positional in nature. Prevertebral soft tissues are within normal limits. The visualized neural foramina are grossly unremarkable. The thyroid gland is unremarkable in appearance. The visualized lung apices are clear. No significant soft tissue abnormalities are seen. IMPRESSION: 1. No evidence of traumatic intracranial injury or fracture. 2. No evidence of fracture or subluxation along the cervical spine. 3. Mild small vessel ischemic microangiopathy. Electronically Signed   By: Roanna Raider M.D.   On:  05/13/2015 01:59   Ct Cervical Spine Wo Contrast  05/13/2015  CLINICAL DATA:  Status post fall down 3 steps. Status post seizure, with concern for head or cervical spine injury. Initial encounter. EXAM: CT HEAD WITHOUT CONTRAST CT CERVICAL SPINE WITHOUT CONTRAST TECHNIQUE: Multidetector CT imaging of the head and cervical spine was performed following the standard protocol without intravenous contrast. Multiplanar CT image reconstructions of the cervical spine were also generated. COMPARISON:  None. FINDINGS: CT HEAD FINDINGS There is no evidence of  acute infarction, mass lesion, or intra- or extra-axial hemorrhage on CT. Mild periventricular and subcortical white matter change likely reflects small vessel ischemic microangiopathy. The posterior fossa, including the cerebellum, brainstem and fourth ventricle, is within normal limits. The third and lateral ventricles, and basal ganglia are unremarkable in appearance. The cerebral hemispheres are symmetric in appearance, with normal gray-white differentiation. No mass effect or midline shift is seen. There is no evidence of fracture; visualized osseous structures are unremarkable in appearance. The orbits are within normal limits. The paranasal sinuses and mastoid air cells are well-aerated. No significant soft tissue abnormalities are seen. CT CERVICAL SPINE FINDINGS There is no evidence of fracture or subluxation. Vertebral bodies demonstrate normal height and alignment. Intervertebral disc spaces are preserved. There is mild reversal of the normal lordotic curvature of the cervical spine, likely positional in nature. Prevertebral soft tissues are within normal limits. The visualized neural foramina are grossly unremarkable. The thyroid gland is unremarkable in appearance. The visualized lung apices are clear. No significant soft tissue abnormalities are seen. IMPRESSION: 1. No evidence of traumatic intracranial injury or fracture. 2. No evidence of fracture or subluxation along the cervical spine. 3. Mild small vessel ischemic microangiopathy. Electronically Signed   By: Roanna Raider M.D.   On: 05/13/2015 01:59   Ct Abdomen Pelvis W Contrast  05/13/2015  CLINICAL DATA:  Status post fall down 3 steps, with seizure. Back pain and lower abdominal tenderness. Initial encounter. EXAM: CT ABDOMEN AND PELVIS WITH CONTRAST TECHNIQUE: Multidetector CT imaging of the abdomen and pelvis was performed using the standard protocol following bolus administration of intravenous contrast. CONTRAST:  ISOVUE-300  IOPAMIDOL (ISOVUE-300) INJECTION 61% COMPARISON:  None. FINDINGS: The visualized lung bases are clear. The liver and spleen are unremarkable in appearance. The gallbladder is within normal limits. The pancreas and adrenal glands are unremarkable. The kidneys are unremarkable in appearance. There is no evidence of hydronephrosis. No renal or ureteral stones are seen. No perinephric stranding is appreciated. No free fluid is identified. The small bowel is unremarkable in appearance. The stomach is within normal limits. No acute vascular abnormalities are seen. The appendix is not well characterized; there is no evidence for appendicitis. The colon is unremarkable in appearance. The bladder is mildly distended and grossly unremarkable. The uterus is unremarkable in appearance. The ovaries are grossly symmetric. No suspicious adnexal masses are seen. No inguinal lymphadenopathy is seen. No acute osseous abnormalities are identified. IMPRESSION: No evidence of traumatic injury to the abdomen or pelvis. Electronically Signed   By: Roanna Raider M.D.   On: 05/13/2015 02:05   I have personally reviewed and evaluated these lab results as part of my medical decision-making.   EKG Interpretation None      Medications  iopamidol (ISOVUE-300) 61 % injection 100 mL (100 mLs Intravenous Contrast Given 05/13/15 0134)    MDM   Final diagnoses:  Fall, initial encounter  Patient presents after a fall down 2 steps at home. Patient did admit  to alcohol use and it is unclear whether she lost consciousness. She stated she had a seizure but no features to suggest actual seizure activity. On exam, she was in a c-collar complaining of neck pain. She had bilateral lower abdominal tenderness and mid to lower thoracic spine tenderness. Because of her intoxication and multiple areas of pain, obtained CT of head, C-spine, and abdomen as well as chest x-ray.  Labwork was unremarkable. Imaging showed no acute injuries. The  patient was able to ambulate without problems in the ED. I discussed supportive care for her contusions and reviewed return precautions. I also discussed treatment for cerumen impaction including no Q-tip use and using peroxide to irrigate ears. Patient voiced understanding and was discharged in satisfactory condition.  I personally performed the services described in this documentation, which was scribed in my presence. The recorded information has been reviewed and is accurate.   Laurence Spatesachel Morgan Little, MD 05/13/15 [phone]

## 2015-05-13 ENCOUNTER — Encounter: Payer: Self-pay | Admitting: Family Medicine

## 2015-05-13 ENCOUNTER — Encounter (HOSPITAL_COMMUNITY): Payer: Self-pay

## 2015-05-13 ENCOUNTER — Emergency Department (HOSPITAL_COMMUNITY): Payer: BLUE CROSS/BLUE SHIELD

## 2015-05-13 ENCOUNTER — Ambulatory Visit: Payer: Self-pay | Admitting: Family Medicine

## 2015-05-13 ENCOUNTER — Ambulatory Visit (INDEPENDENT_AMBULATORY_CARE_PROVIDER_SITE_OTHER): Payer: BLUE CROSS/BLUE SHIELD | Admitting: Family Medicine

## 2015-05-13 VITALS — BP 156/113 | HR 73 | Ht 65.0 in | Wt 160.0 lb

## 2015-05-13 DIAGNOSIS — S39012A Strain of muscle, fascia and tendon of lower back, initial encounter: Secondary | ICD-10-CM

## 2015-05-13 DIAGNOSIS — R21 Rash and other nonspecific skin eruption: Secondary | ICD-10-CM | POA: Diagnosis not present

## 2015-05-13 DIAGNOSIS — S161XXA Strain of muscle, fascia and tendon at neck level, initial encounter: Secondary | ICD-10-CM | POA: Diagnosis not present

## 2015-05-13 DIAGNOSIS — S060X1A Concussion with loss of consciousness of 30 minutes or less, initial encounter: Secondary | ICD-10-CM | POA: Diagnosis not present

## 2015-05-13 LAB — I-STAT CHEM 8, ED
BUN: 16 mg/dL (ref 6–20)
CREATININE: 0.9 mg/dL (ref 0.44–1.00)
Calcium, Ion: 1.19 mmol/L (ref 1.12–1.23)
Chloride: 106 mmol/L (ref 101–111)
Glucose, Bld: 85 mg/dL (ref 65–99)
HEMATOCRIT: 41 % (ref 36.0–46.0)
HEMOGLOBIN: 13.9 g/dL (ref 12.0–15.0)
POTASSIUM: 3.5 mmol/L (ref 3.5–5.1)
Sodium: 143 mmol/L (ref 135–145)
TCO2: 24 mmol/L (ref 0–100)

## 2015-05-13 LAB — CBC WITH DIFFERENTIAL/PLATELET
BASOS ABS: 0 10*3/uL (ref 0.0–0.1)
BASOS PCT: 0 %
Eosinophils Absolute: 0.1 10*3/uL (ref 0.0–0.7)
Eosinophils Relative: 1 %
HEMATOCRIT: 38.7 % (ref 36.0–46.0)
HEMOGLOBIN: 14.1 g/dL (ref 12.0–15.0)
Lymphocytes Relative: 33 %
Lymphs Abs: 2.5 10*3/uL (ref 0.7–4.0)
MCH: 30.2 pg (ref 26.0–34.0)
MCHC: 36.4 g/dL — ABNORMAL HIGH (ref 30.0–36.0)
MCV: 82.9 fL (ref 78.0–100.0)
Monocytes Absolute: 0.5 10*3/uL (ref 0.1–1.0)
Monocytes Relative: 6 %
NEUTROS PCT: 60 %
Neutro Abs: 4.5 10*3/uL (ref 1.7–7.7)
Platelets: 213 10*3/uL (ref 150–400)
RBC: 4.67 MIL/uL (ref 3.87–5.11)
RDW: 14.4 % (ref 11.5–15.5)
WBC: 7.6 10*3/uL (ref 4.0–10.5)

## 2015-05-13 MED ORDER — PREDNISONE 10 MG PO TABS
ORAL_TABLET | ORAL | Status: DC
Start: 2015-05-13 — End: 2016-04-20

## 2015-05-13 MED ORDER — IOPAMIDOL (ISOVUE-300) INJECTION 61%
100.0000 mL | Freq: Once | INTRAVENOUS | Status: AC | PRN
  Administered 2015-05-13: 100 mL via INTRAVENOUS

## 2015-05-13 MED ORDER — HYDROCODONE-ACETAMINOPHEN 5-325 MG PO TABS: 1.0000 | ORAL_TABLET | Freq: Four times a day (QID) | ORAL | Status: DC | PRN

## 2015-05-13 MED ORDER — VALACYCLOVIR HCL 1 G PO TABS: 1000.0000 mg | ORAL_TABLET | Freq: Three times a day (TID) | ORAL | Status: DC

## 2015-05-13 MED FILL — HYDROCODON-APAP 5-325: 5-325 | 10 days supply | Qty: 40 | Fill #0

## 2015-05-13 NOTE — ED Notes (Signed)
Pt ambulates around RN station w/ limp to right side. Pts significant other states limp is new post-fall. Pt denies SOB or dyspnea during ambulation.

## 2015-05-13 NOTE — Patient Instructions (Addendum)
The rash on your neck is most likely an allergic dermatitis though shingles is a possibility. Take valtrex as directed until this is gone. Prednisone for 6 days as well. Day AFTER finishing prednisone ok to try aleve 2 tabs twice a day with food. Hydrocodone as needed for severe pain (no driving on this medicine). You have sustained a mild concussion, cervical and lumbar strains. We will consider adding physical therapy at follow-up if you're not improving vs home exercise program.

## 2015-05-14 DIAGNOSIS — S060X9A Concussion with loss of consciousness of unspecified duration, initial encounter: Secondary | ICD-10-CM | POA: Insufficient documentation

## 2015-05-14 DIAGNOSIS — S39012A Strain of muscle, fascia and tendon of lower back, initial encounter: Secondary | ICD-10-CM | POA: Insufficient documentation

## 2015-05-14 DIAGNOSIS — R21 Rash and other nonspecific skin eruption: Secondary | ICD-10-CM | POA: Insufficient documentation

## 2015-05-14 DIAGNOSIS — S161XXA Strain of muscle, fascia and tendon at neck level, initial encounter: Secondary | ICD-10-CM | POA: Insufficient documentation

## 2015-05-14 NOTE — Assessment & Plan Note (Signed)
no red flags.  CT cervical spine was reassuring.  Consistent with strain.  Prednisone dose pack then aleve.  Norco as needed for severe pain.  Consider physical therapy at follow-up in 2 weeks.

## 2015-05-14 NOTE — Progress Notes (Signed)
PCP: No PCP Per Patient  Subjective:   HPI: Patient is a 55 y.o. female here for multiple complaints following a fall.  Patient reports she was taking out the trash on 4/24 when she fell down 4 steps landing forward with her hands protecting her face. Thinks she lost consciousness briefly. Has a headache, dizziness, a little nausea. Her neck is sore more on left side - worse with turning head. Low back and bilateral hips sore with worse pain on sitting. Pain level overall feels like a 10/10, constant and sharp. No bowel/bladder dysfunction. No numbness or tingling. Has a rash on back of neck also that is itchy and slightly burns. She had CT scans of abdomen, pelvis, head, cervical spine and chest x-ray all without acute abnormalities.  Past Medical History  Diagnosis Date  . Seizures (HCC)   . Hypertension     No current outpatient prescriptions on file prior to visit.   No current facility-administered medications on file prior to visit.    No past surgical history on file.  No Known Allergies  Social History   Social History  . Marital Status: Single    Spouse Name: N/A  . Number of Children: N/A  . Years of Education: N/A   Occupational History  . Not on file.   Social History Main Topics  . Smoking status: Never Smoker   . Smokeless tobacco: Not on file  . Alcohol Use: No  . Drug Use: No  . Sexual Activity: Not on file   Other Topics Concern  . Not on file   Social History Narrative    No family history on file.  BP 156/113 mmHg  Pulse 73  Ht 5\' 5"  (1.651 m)  Wt 160 lb (72.576 kg)  BMI 26.63 kg/m2  Review of Systems: See HPI above.    Objective:  Physical Exam:  Gen: NAD, comfortable in exam room  Neck: Vesicular rash posterior neck without red base.  Appears to slightly cross midline to right. TTP bilateral paraspinal regions.  No midline/bony TTP. FROM neck - pain on bilateral lateral rotations and flexion. BUE strength 5/5.    Sensation intact to light touch.   2+ equal reflexes in triceps, biceps, brachioradialis tendons. Negative spurlings. NV intact distal BUEs.  Back: No gross deformity, scoliosis. TTP bilateral paraspinal regions.  No midline or bony TTP. FROM with pain on flexion > extension. Strength LEs 5/5 all muscle groups.   2+ MSRs in patellar and achilles tendons, equal bilaterally. Negative SLRs. Sensation intact to light touch bilaterally. Negative logroll bilateral hips    Assessment & Plan:  1. Cervical strain - no red flags.  CT cervical spine was reassuring.  Consistent with strain.  Prednisone dose pack then aleve.  Norco as needed for severe pain.  Consider physical therapy at follow-up in 2 weeks.  2. Lumbar strain - no red flags.  CT abd/pelvis was reassuring.  Consistent with strain.  Prednisone dose pack then aleve.  Norco as needed for severe pain.  Consider physical therapy at follow-up in 2 weeks.  3. Rash - most likely a contact dermatitis as it appears to slightly cross midline though could not rule out shingles.  Suspect had reaction to something on cervical collar (declines that she wears new necklace to cause this).  Valtrex and prednisone as directed.  4. Mild concussion - describes symptoms of concussion.  Will monitor.

## 2015-05-14 NOTE — Assessment & Plan Note (Signed)
no red flags.  CT abd/pelvis was reassuring.  Consistent with strain.  Prednisone dose pack then aleve.  Norco as needed for severe pain.  Consider physical therapy at follow-up in 2 weeks.

## 2015-05-14 NOTE — Assessment & Plan Note (Signed)
most likely a contact dermatitis as it appears to slightly cross midline though could not rule out shingles.  Suspect had reaction to something on cervical collar (declines that she wears new necklace to cause this).  Valtrex and prednisone as directed.

## 2015-05-14 NOTE — Assessment & Plan Note (Signed)
Mild concussion - describes symptoms of concussion.  Will monitor.

## 2015-05-23 ENCOUNTER — Encounter: Payer: Self-pay | Admitting: Family Medicine

## 2015-05-23 ENCOUNTER — Ambulatory Visit (INDEPENDENT_AMBULATORY_CARE_PROVIDER_SITE_OTHER): Payer: BLUE CROSS/BLUE SHIELD | Admitting: Family Medicine

## 2015-05-23 VITALS — BP 133/98 | HR 74 | Ht 65.0 in | Wt 168.0 lb

## 2015-05-23 DIAGNOSIS — S161XXD Strain of muscle, fascia and tendon at neck level, subsequent encounter: Secondary | ICD-10-CM | POA: Diagnosis not present

## 2015-05-23 DIAGNOSIS — S060X1D Concussion with loss of consciousness of 30 minutes or less, subsequent encounter: Secondary | ICD-10-CM

## 2015-05-23 DIAGNOSIS — S39012D Strain of muscle, fascia and tendon of lower back, subsequent encounter: Secondary | ICD-10-CM

## 2015-05-23 DIAGNOSIS — R21 Rash and other nonspecific skin eruption: Secondary | ICD-10-CM | POA: Diagnosis not present

## 2015-05-23 NOTE — Patient Instructions (Signed)
Try hydrocortisone ointment to your neck - apply it a couple times a day until this has resolved. If over next 1-2 weeks it's not improving call me and we can send in a stronger ointment (triamcinolone) to use. Aleve twice a day with food as needed now. Ok to take hydrocodone if having severe pain. Follow up with me in 4 weeks. Do home exercises for your low back.

## 2015-05-23 NOTE — Assessment & Plan Note (Signed)
consistent with contact dermatitis.  Discussed topical hydrocortisone, benadryl, zyrtec.  If not improving consider triamcinolone ointment.

## 2015-05-23 NOTE — Assessment & Plan Note (Signed)
no red flags.  CT cervical spine was reassuring.  Consistent with strain.  Much improved following prednisone with norco as needed.

## 2015-05-23 NOTE — Assessment & Plan Note (Signed)
Mild concussion - describes symptoms of concussion.  Will monitor.  Still with mild headache.

## 2015-05-23 NOTE — Progress Notes (Signed)
PCP: No PCP Per Patient  Subjective:   HPI: Patient is a 55 y.o. female here for multiple complaints following a fall.  4/25: Patient reports she was taking out the trash on 4/24 when she fell down 4 steps landing forward with her hands protecting her face. Thinks she lost consciousness briefly. Has a headache, dizziness, a little nausea. Her neck is sore more on left side - worse with turning head. Low back and bilateral hips sore with worse pain on sitting. Pain level overall feels like a 10/10, constant and sharp. No bowel/bladder dysfunction. No numbness or tingling. Has a rash on back of neck also that is itchy and slightly burns. She had CT scans of abdomen, pelvis, head, cervical spine and chest x-ray all without acute abnormalities.  5/5: Patient reports she's doing much better. Still with some soreness of low back, left ribs primarily. No pain currently of neck. Has a slight headache since the accident. No report of dizziness, other complaints Pain level currently a 0/10 when resting. Finished prednisone, valtrex. Takes hydrocodone as needed. Rash still present posterior neck and itchy. No bowel/bladder dysfunction. No radiation into legs. Past Medical History  Diagnosis Date  . Seizures (HCC)   . Hypertension     Current Outpatient Prescriptions on File Prior to Visit  Medication Sig Dispense Refill  . HYDROcodone-acetaminophen (NORCO) 5-325 MG tablet Take 1 tablet by mouth every 6 (six) hours as needed for moderate pain. 40 tablet 0  . predniSONE (DELTASONE) 10 MG tablet 6 tabs po day 1, 5 tabs po day 2, 4 tabs po day 3, 3 tabs po day 4, 2 tabs po day 5, 1 tab po day 6 21 tablet 0  . valACYclovir (VALTREX) 1000 MG tablet Take 1 tablet (1,000 mg total) by mouth 3 (three) times daily. 21 tablet 0   No current facility-administered medications on file prior to visit.    No past surgical history on file.  No Known Allergies  Social History   Social History   . Marital Status: Single    Spouse Name: N/A  . Number of Children: N/A  . Years of Education: N/A   Occupational History  . Not on file.   Social History Main Topics  . Smoking status: Never Smoker   . Smokeless tobacco: Not on file  . Alcohol Use: No  . Drug Use: No  . Sexual Activity: Not on file   Other Topics Concern  . Not on file   Social History Narrative    No family history on file.  BP 133/98 mmHg  Pulse 74  Ht 5\' 5"  (1.651 m)  Wt 168 lb (76.204 kg)  BMI 27.96 kg/m2  Review of Systems: See HPI above.    Objective:  Physical Exam:  Gen: NAD, comfortable in exam room  Neck: Vesicular rash posterior neck without red base now crossing midline.  TTP bilateral paraspinal regions.  No midline/bony TTP. FROM neck without pain BUE strength 5/5.   Sensation intact to light touch.   NV intact distal BUEs.  Back: No gross deformity, scoliosis. Mild TTP L > R paraspinal regions.  No midline or bony TTP. FROM. Strength LEs 5/5 all muscle groups.   Negative SLRs. Sensation intact to light touch bilaterally. Negative logroll bilateral hips    Assessment & Plan:  1. Cervical strain - no red flags.  CT cervical spine was reassuring.  Consistent with strain.  Much improved following prednisone with norco as needed.  2. Lumbar strain -  no red flags.  CT abd/pelvis was reassuring.  Consistent with strain.  Improving but still with some soreness here.  Aleve with norco as needed.  S/p prednisone.  Home exercises reviewed.    3. Rash - consistent with contact dermatitis.  Discussed topical hydrocortisone, benadryl, zyrtec.  If not improving consider triamcinolone ointment.    4. Mild concussion - describes symptoms of concussion.  Will monitor.  Still with mild headache.

## 2015-05-23 NOTE — Assessment & Plan Note (Signed)
no red flags.  CT abd/pelvis was reassuring.  Consistent with strain.  Improving but still with some soreness here.  Aleve with norco as needed.  S/p prednisone.  Home exercises reviewed.

## 2016-04-06 ENCOUNTER — Encounter: Payer: Self-pay | Admitting: Family Medicine

## 2016-04-06 ENCOUNTER — Ambulatory Visit (INDEPENDENT_AMBULATORY_CARE_PROVIDER_SITE_OTHER): Payer: BLUE CROSS/BLUE SHIELD | Admitting: Family Medicine

## 2016-04-06 VITALS — BP 140/86 | HR 74 | Temp 97.8°F | Ht 65.0 in | Wt 160.0 lb

## 2016-04-06 DIAGNOSIS — Z1231 Encounter for screening mammogram for malignant neoplasm of breast: Secondary | ICD-10-CM | POA: Diagnosis not present

## 2016-04-06 DIAGNOSIS — Z1211 Encounter for screening for malignant neoplasm of colon: Secondary | ICD-10-CM | POA: Diagnosis not present

## 2016-04-06 DIAGNOSIS — Z23 Encounter for immunization: Secondary | ICD-10-CM

## 2016-04-06 DIAGNOSIS — Z1159 Encounter for screening for other viral diseases: Secondary | ICD-10-CM | POA: Diagnosis not present

## 2016-04-06 DIAGNOSIS — Z113 Encounter for screening for infections with a predominantly sexual mode of transmission: Secondary | ICD-10-CM

## 2016-04-06 DIAGNOSIS — Z131 Encounter for screening for diabetes mellitus: Secondary | ICD-10-CM | POA: Diagnosis not present

## 2016-04-06 DIAGNOSIS — R030 Elevated blood-pressure reading, without diagnosis of hypertension: Secondary | ICD-10-CM

## 2016-04-06 DIAGNOSIS — Z13220 Encounter for screening for lipoid disorders: Secondary | ICD-10-CM

## 2016-04-06 DIAGNOSIS — R232 Flushing: Secondary | ICD-10-CM | POA: Diagnosis not present

## 2016-04-06 DIAGNOSIS — Z1239 Encounter for other screening for malignant neoplasm of breast: Secondary | ICD-10-CM

## 2016-04-06 MED ORDER — VENLAFAXINE HCL ER 37.5 MG PO TB24: 1.0000 | ORAL_TABLET | Freq: Every day | ORAL | 1 refills | Status: DC

## 2016-04-06 NOTE — Patient Instructions (Addendum)
Aspirin 81 mg for cardiovascular protection.  Start Venlafaxine 37.5 once daily for hot flashes related to menopause.  I have attached an information sheet regarding the what to expect when starting Venlafaxine.   Venlafaxine extended-release capsules What is this medicine? VENLAFAXINE(VEN la fax een) is used to treat depression, anxiety and panic disorder. This medicine may be used for other purposes; ask your health care provider or pharmacist if you have questions. COMMON BRAND NAME(S): Effexor XR What should I tell my health care provider before I take this medicine? They need to know if you have any of these conditions: -bleeding disorders -glaucoma -heart disease -high blood pressure -high cholesterol -kidney disease -liver disease -low levels of sodium in the blood -mania or bipolar disorder -seizures -suicidal thoughts, plans, or attempt; a previous suicide attempt by you or a family -take medicines that treat or prevent blood clots -thyroid disease -an unusual or allergic reaction to venlafaxine, desvenlafaxine, other medicines, foods, dyes, or preservatives -pregnant or trying to get pregnant -breast-feeding How should I use this medicine? Take this medicine by mouth with a full glass of water. Follow the directions on the prescription label. Do not cut, crush, or chew this medicine. Take it with food. If needed, the capsule may be carefully opened and the entire contents sprinkled on a spoonful of cool applesauce. Swallow the applesauce/pellet mixture right away without chewing and follow with a glass of water to ensure complete swallowing of the pellets. Try to take your medicine at about the same time each day. Do not take your medicine more often than directed. Do not stop taking this medicine suddenly except upon the advice of your doctor. Stopping this medicine too quickly may cause serious side effects or your condition may worsen. A special MedGuide will be given to  you by the pharmacist with each prescription and refill. Be sure to read this information carefully each time. Talk to your pediatrician regarding the use of this medicine in children. Special care may be needed. Overdosage: If you think you have taken too much of this medicine contact a poison control center or emergency room at once. NOTE: This medicine is only for you. Do not share this medicine with others. What if I miss a dose? If you miss a dose, take it as soon as you can. If it is almost time for your next dose, take only that dose. Do not take double or extra doses. What may interact with this medicine? Do not take this medicine with any of the following medications: -certain medicines for fungal infections like fluconazole, itraconazole, ketoconazole, posaconazole, voriconazole -cisapride -desvenlafaxine -dofetilide -dronedarone -duloxetine -levomilnacipran -linezolid -MAOIs like Carbex, Eldepryl, Marplan, Nardil, and Parnate -methylene blue (injected into a vein) -milnacipran -pimozide -thioridazine -ziprasidone This medicine may also interact with the following medications: -amphetamines -aspirin and aspirin-like medicines -certain medicines for depression, anxiety, or psychotic disturbances -certain medicines for migraine headaches like almotriptan, eletriptan, frovatriptan, naratriptan, rizatriptan, sumatriptan, zolmitriptan -certain medicines for sleep -certain medicines that treat or prevent blood clots like dalteparin, enoxaparin, warfarin -cimetidine -clozapine -diuretics -fentanyl -furazolidone -indinavir -isoniazid -lithium -metoprolol -NSAIDS, medicines for pain and inflammation, like ibuprofen or naproxen -other medicines that prolong the QT interval (cause an abnormal heart rhythm) -procarbazine -rasagiline -supplements like St. John's wort, kava kava, valerian -tramadol -tryptophan This list may not describe all possible interactions. Give your  health care provider a list of all the medicines, herbs, non-prescription drugs, or dietary supplements you use. Also tell them if you smoke, drink  alcohol, or use illegal drugs. Some items may interact with your medicine. What should I watch for while using this medicine? Tell your doctor if your symptoms do not get better or if they get worse. Visit your doctor or health care professional for regular checks on your progress. Because it may take several weeks to see the full effects of this medicine, it is important to continue your treatment as prescribed by your doctor. Patients and their families should watch out for new or worsening thoughts of suicide or depression. Also watch out for sudden changes in feelings such as feeling anxious, agitated, panicky, irritable, hostile, aggressive, impulsive, severely restless, overly excited and hyperactive, or not being able to sleep. If this happens, especially at the beginning of treatment or after a change in dose, call your health care professional. This medicine can cause an increase in blood pressure. Check with your doctor for instructions on monitoring your blood pressure while taking this medicine. You may get drowsy or dizzy. Do not drive, use machinery, or do anything that needs mental alertness until you know how this medicine affects you. Do not stand or sit up quickly, especially if you are an older patient. This reduces the risk of dizzy or fainting spells. Alcohol may interfere with the effect of this medicine. Avoid alcoholic drinks. Your mouth may get dry. Chewing sugarless gum, sucking hard candy and drinking plenty of water will help. Contact your doctor if the problem does not go away or is severe. What side effects may I notice from receiving this medicine? Side effects that you should report to your doctor or health care professional as soon as possible: -allergic reactions like skin rash, itching or hives, swelling of the face, lips, or  tongue -anxious -breathing problems -confusion -changes in vision -chest pain -confusion -elevated mood, decreased need for sleep, racing thoughts, impulsive behavior -eye pain -fast, irregular heartbeat -feeling faint or lightheaded, falls -feeling agitated, angry, or irritable -hallucination, loss of contact with reality -high blood pressure -loss of balance or coordination -palpitations -redness, blistering, peeling or loosening of the skin, including inside the mouth -restlessness, pacing, inability to keep still -seizures -stiff muscles -suicidal thoughts or other mood changes -trouble passing urine or change in the amount of urine -trouble sleeping -unusual bleeding or bruising -unusually weak or tired -vomiting Side effects that usually do not require medical attention (report to your doctor or health care professional if they continue or are bothersome): -change in sex drive or performance -change in appetite or weight -constipation -dizziness -dry mouth -headache -increased sweating -nausea -tired This list may not describe all possible side effects. Call your doctor for medical advice about side effects. You may report side effects to FDA at 1-800-FDA-1088. Where should I keep my medicine? Keep out of the reach of children. Store at a controlled temperature between 20 and 25 degrees C (68 degrees and 77 degrees F), in a dry place. Throw away any unused medicine after the expiration date. NOTE: This sheet is a summary. It may not cover all possible information. If you have questions about this medicine, talk to your doctor, pharmacist, or health care provider.  2018 Elsevier/Gold Standard (2015-06-05 18:38:02)

## 2016-04-06 NOTE — Progress Notes (Addendum)
Patient ID: Shari Gonzales, female    DOB: 06/24/1960, 56 y.o.   MRN: [id number]  PCP: Joaquin CourtsKimberly Arria Naim, FNP  Chief Complaint  Patient presents with  . Establish Care  . Hot Flashes    Subjective:  HPI Shari Gonzales is a 56 y.o. female, single, non-smoker, presents today to establish care, discuss worsening hot flashes, and  review outstanding health maintenance concerns. She is a mother 3 adult children. 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 The only recent medical care she has received is for chronic pain of the left hand for which she is followed by [facility]. Denies taking any medications and reports being told in the past that she has "high blood pressure" for which she has never received medication treatment.  Health promotion screenings  Hepatitis C, HIV screening, TDAP, Pap smear,Mammogram, Colonoscopy are all overdue and she would like to schedule these screenings today and received TDAP vaccine at next visit.  Hot Flashes  She has been postmenopausal for over 5 years.  Reports that she has suffered from some form of "hot flashes" for years although feels over the last 12 months her symptoms have significantly worsened. Report awakening to a completely soaked bed related to sweating. Episodes occur during work and are followed by very cold episodes.  Denies any prior treatment with medication.  Social History   Social History  . Marital status: Single    Spouse name: N/A  . Number of children: N/A  . Years of education: N/A   Occupational History  . Not on file.   Social History Main Topics  . Smoking status: Never Smoker  . Smokeless tobacco: Never Used  . Alcohol use No  . Drug use: No  . Sexual activity: Not on file   Other Topics Concern  . Not on file   Social History Narrative  . No narrative on file    No family history on file.   Review of Systems  Constitutional: Positive for  diaphoresis.  HENT: Negative.   Eyes: Negative.   Respiratory: Negative.   Cardiovascular: Negative.   Gastrointestinal: Negative.        Reports good water intake 5-6 glasses per day  Endocrine: Positive for heat intolerance.  Genitourinary: Negative.   Musculoskeletal: Positive for arthralgias.  Neurological: Positive for headaches.       Headaches controlled with Advil and do not occur frequently.  Hematological: Negative.   Psychiatric/Behavioral: Negative for self-injury, sleep disturbance and suicidal ideas. The patient is not nervous/anxious.     Patient Active Problem List   Diagnosis Date Noted  . Cervical strain 05/14/2015  . Lumbar strain 05/14/2015  . Concussion with loss of consciousness 05/14/2015  . Rash and nonspecific skin eruption 05/14/2015  . Injury of left shoulder 11/26/2014    No Known Allergies  Prior to Admission medications   Medication Sig Start Date End Date Taking? Authorizing Provider  HYDROcodone-acetaminophen (NORCO) 5-325 MG tablet Take 1 tablet by mouth every 6 (six) hours as needed for moderate pain. Patient not taking: Reported on 04/06/2016 05/13/15   Lenda KelpShane R Hudnall, MD  predniSONE (DELTASONE) 10 MG tablet 6 tabs po day 1, 5 tabs po day 2, 4 tabs po day 3, 3 tabs po day 4, 2 tabs po day 5, 1 tab po day 6 Patient not taking: Reported on 04/06/2016 05/13/15   Lenda KelpShane R Hudnall, MD  valACYclovir (VALTREX) 1000 MG tablet Take 1  tablet (1,000 mg total) by mouth 3 (three) times daily. Patient not taking: Reported on 04/06/2016 05/13/15   Lenda Kelp, MD    Past Medical, Surgical Family and Social History reviewed and updated.    Objective:   Today's Vitals   04/06/16 1016  BP: 140/86  Pulse: 74  Temp: 97.8 F (36.6 C)  TempSrc: Oral  SpO2: 100%  Weight: 160 lb (72.6 kg)  Height: 5\' 5"  (1.651 m)    Wt Readings from Last 3 Encounters:  04/06/16 160 lb (72.6 kg)  05/23/15 168 lb (76.2 kg)  05/13/15 160 lb (72.6 kg)    Physical Exam   Constitutional: She is oriented to person, place, and time. She appears well-developed and well-nourished.  HENT:  Head: Normocephalic and atraumatic.  Right Ear: External ear normal.  Left Ear: External ear normal.  Nose: Nose normal.  Mouth/Throat: Oropharynx is clear and moist.  Eyes: Conjunctivae and EOM are normal. Pupils are equal, round, and reactive to light.  Neck: Normal range of motion. Neck supple. No thyromegaly present.  Cardiovascular: Normal rate, regular rhythm, normal heart sounds and intact distal pulses.   Pulmonary/Chest: Effort normal and breath sounds normal.  Abdominal: Soft. Bowel sounds are normal.  Musculoskeletal: Normal range of motion.  Lymphadenopathy:    She has no cervical adenopathy.  Neurological: She is alert and oriented to person, place, and time.  Skin: Skin is warm and dry.  Psychiatric: She has a normal mood and affect. Her behavior is normal. Judgment and thought content normal.      Assessment & Plan:  1. Hot flashes -Screen for underlying thyroid disease with TSH panel. Symptoms are causing sweating episodes which are interfering with ADL. Plan: Start Venlafaxine 37.5 mg daily.  Thyroid panel pending (will obtain with other fasting labs.)  2. Need for immunization against influenza - Flu Vaccine QUAD 36+ mos PF IM (Fluarix & Fluzone Quad PF)  3. Screening for diabetes mellitus - Urinalysis - Glucose/Protein; Future - Comprehensive metabolic panel; Future  4. Screen for STD (sexually transmitted disease) - HIV antibody (with reflex); Future  5. Encounter for hepatitis C screening test for low risk patient - Hepatitis C antibody; Future  6. Screen for colon cancer - Ambulatory referral to Gastroenterology  7. Screening, lipid - Lipid panel; Future  8. Screening for breast cancer - MM Digital Screening; Future  9. Elevated blood pressure -Recheck in 2 weeks if remains greater than 140/80, start on antihypertension  medicaion   -Return tomorrow for fasting labs -Return in 2 weeks for CPE/PAP/EKG/TDAP   Godfrey Pick. Tiburcio Pea, MSN, [facility] [address] [place], [place] [address] [phone]

## 2016-04-07 ENCOUNTER — Other Ambulatory Visit: Payer: BLUE CROSS/BLUE SHIELD

## 2016-04-07 DIAGNOSIS — Z13220 Encounter for screening for lipoid disorders: Secondary | ICD-10-CM | POA: Diagnosis not present

## 2016-04-07 DIAGNOSIS — Z113 Encounter for screening for infections with a predominantly sexual mode of transmission: Secondary | ICD-10-CM | POA: Diagnosis not present

## 2016-04-07 DIAGNOSIS — Z131 Encounter for screening for diabetes mellitus: Secondary | ICD-10-CM

## 2016-04-07 DIAGNOSIS — Z1159 Encounter for screening for other viral diseases: Secondary | ICD-10-CM

## 2016-04-07 DIAGNOSIS — R232 Flushing: Secondary | ICD-10-CM | POA: Diagnosis not present

## 2016-04-07 LAB — POCT URINALYSIS DIP (DEVICE)
Bilirubin Urine: NEGATIVE
Glucose, UA: NEGATIVE mg/dL
HGB URINE DIPSTICK: NEGATIVE
Ketones, ur: NEGATIVE mg/dL
Leukocytes, UA: NEGATIVE
NITRITE: NEGATIVE
PH: 5.5 (ref 5.0–8.0)
Protein, ur: NEGATIVE mg/dL
Specific Gravity, Urine: >=1.03 (ref 1.005–1.030)
UROBILINOGEN UA: 0.2 mg/dL (ref 0.0–1.0)

## 2016-04-07 LAB — CBC WITH DIFFERENTIAL/PLATELET
BASOS PCT: 0 %
Basophils Absolute: 0 cells/uL (ref 0–200)
EOS ABS: 69 {cells}/uL (ref 15–500)
Eosinophils Relative: 1 %
HEMATOCRIT: 40.6 % (ref 35.0–45.0)
Hemoglobin: 14 g/dL (ref 11.7–15.5)
LYMPHS ABS: 1794 {cells}/uL (ref 850–3900)
Lymphocytes Relative: 26 %
MCH: 30.8 pg (ref 27.0–33.0)
MCHC: 34.5 g/dL (ref 32.0–36.0)
MCV: 89.4 fL (ref 80.0–100.0)
MONO ABS: 414 {cells}/uL (ref 200–950)
MPV: 9.6 fL (ref 7.5–12.5)
Monocytes Relative: 6 %
NEUTROS ABS: 4623 {cells}/uL (ref 1500–7800)
Neutrophils Relative %: 67 %
Platelets: 238 10*3/uL (ref 140–400)
RBC: 4.54 MIL/uL (ref 3.80–5.10)
RDW: 15 % (ref 11.0–15.0)
WBC: 6.9 10*3/uL (ref 3.8–10.8)

## 2016-04-08 LAB — COMPREHENSIVE METABOLIC PANEL
ALBUMIN: 4.4 g/dL (ref 3.6–5.1)
ALT: 15 U/L (ref 6–29)
AST: 15 U/L (ref 10–35)
Alkaline Phosphatase: 79 U/L (ref 33–130)
BUN: 16 mg/dL (ref 7–25)
CALCIUM: 9.6 mg/dL (ref 8.6–10.4)
CHLORIDE: 111 mmol/L — AB (ref 98–110)
CO2: 24 mmol/L (ref 20–31)
Creat: 0.72 mg/dL (ref 0.50–1.05)
Glucose, Bld: 81 mg/dL (ref 65–99)
POTASSIUM: 4.1 mmol/L (ref 3.5–5.3)
SODIUM: 144 mmol/L (ref 135–146)
Total Bilirubin: 0.4 mg/dL (ref 0.2–1.2)
Total Protein: 7.1 g/dL (ref 6.1–8.1)

## 2016-04-08 LAB — HEPATITIS C ANTIBODY: HCV Ab: REACTIVE — AB

## 2016-04-08 LAB — THYROID PANEL WITH TSH
Free Thyroxine Index: 1.7 (ref 1.4–3.8)
T3 Uptake: 30 % (ref 22–35)
T4, Total: 5.8 ug/dL (ref 4.5–12.0)
TSH: 0.99 mIU/L

## 2016-04-08 LAB — LIPID PANEL
Cholesterol: 187 mg/dL (ref <200)
HDL: 107 mg/dL (ref >50)
LDL CALC: 65 mg/dL (ref <100)
Total CHOL/HDL Ratio: 1.7 Ratio (ref <5.0)
Triglycerides: 73 mg/dL (ref <150)
VLDL: 15 mg/dL (ref <30)

## 2016-04-09 LAB — HEPATITIS C RNA QUANTITATIVE
HCV QUANT LOG: NOT DETECTED {Log_IU}/mL
HCV QUANT: NOT DETECTED [IU]/mL

## 2016-04-10 LAB — HIV ANTIBODY (ROUTINE TESTING W REFLEX): HIV: NONREACTIVE

## 2016-04-20 ENCOUNTER — Other Ambulatory Visit: Payer: Self-pay | Admitting: Family Medicine

## 2016-04-20 ENCOUNTER — Other Ambulatory Visit (HOSPITAL_COMMUNITY)
Admission: RE | Admit: 2016-04-20 | Discharge: 2016-04-20 | Disposition: A | Discharge status: Home / Self Care | Payer: BLUE CROSS/BLUE SHIELD | Source: Ambulatory Visit | Attending: Family Medicine | Admitting: Family Medicine

## 2016-04-20 ENCOUNTER — Encounter: Payer: Self-pay | Admitting: Family Medicine

## 2016-04-20 ENCOUNTER — Ambulatory Visit (INDEPENDENT_AMBULATORY_CARE_PROVIDER_SITE_OTHER): Payer: BLUE CROSS/BLUE SHIELD | Admitting: Family Medicine

## 2016-04-20 VITALS — BP 144/88 | HR 82 | Temp 98.7°F | Ht 65.0 in | Wt 158.0 lb

## 2016-04-20 DIAGNOSIS — Z01419 Encounter for gynecological examination (general) (routine) without abnormal findings: Secondary | ICD-10-CM | POA: Diagnosis not present

## 2016-04-20 DIAGNOSIS — Z1329 Encounter for screening for other suspected endocrine disorder: Secondary | ICD-10-CM | POA: Diagnosis not present

## 2016-04-20 DIAGNOSIS — Z1231 Encounter for screening mammogram for malignant neoplasm of breast: Secondary | ICD-10-CM

## 2016-04-20 DIAGNOSIS — R030 Elevated blood-pressure reading, without diagnosis of hypertension: Secondary | ICD-10-CM

## 2016-04-20 DIAGNOSIS — Z131 Encounter for screening for diabetes mellitus: Secondary | ICD-10-CM

## 2016-04-20 MED ORDER — LOSARTAN POTASSIUM 25 MG PO TABS: 25.0000 mg | ORAL_TABLET | Freq: Every day | ORAL | 1 refills | Status: DC

## 2016-04-20 MED ORDER — VENLAFAXINE HCL ER 37.5 MG PO TB24: 1.0000 | ORAL_TABLET | Freq: Every day | ORAL | 1 refills | Status: DC

## 2016-04-20 NOTE — Progress Notes (Signed)
Patient ID: Shari Gonzales, female    DOB: 1960/02/10, 56 y.o.   MRN: [id number]  PCP: Joaquin Courts, FNP    Subjective:  HPI  Shari Gonzales is a 56 y.o. female presents for 2 week hypertension follow-up and PAP Smear. Shari Gonzales was seen 04/06/16 to establish care here at [facility]. During that visit we reviewed overdue maintenance items and addressed elevated blood readings. She presents today for blood pressure recheck and gynecological exam. Shari Gonzales reports that she was able to schedule her breast mammogram for later on this month. Reviewed most recent labs results with Shari Gonzales from last office visit.  Explained that the screening for hepatitis C was initially reactive although confirmatory RNA test was negative. Shari Gonzales reports no prior alcohol or drug use or known hx of elevated liver enzymes.  All other lab findings were clinically insignificant. Reports no current problems. Hot flashes have resolved completely with Effexor at current dose.  Social History   Social History  . Marital status: Single    Spouse name: N/A  . Number of children: N/A  . Years of education: N/A   Occupational History  . Not on file.   Social History Main Topics  . Smoking status: Never Smoker  . Smokeless tobacco: Never Used  . Alcohol use No  . Drug use: No  . Sexual activity: Not on file   Other Topics Concern  . Not on file   Social History Narrative  . No narrative on file    No family history on file.   Review of Systems  Constitutional: Negative for activity change, appetite change, fatigue and unexpected weight change.  Eyes: Negative.   Cardiovascular: Negative.   Gastrointestinal: Negative.   Genitourinary: Negative.   Hematological: Negative.     Patient Active Problem List   Diagnosis Date Noted  . Cervical strain 05/14/2015  . Lumbar strain 05/14/2015  . Concussion with loss of consciousness 05/14/2015  . Rash and nonspecific skin eruption 05/14/2015  . Injury of  left shoulder 11/26/2014    No Known Allergies  Prior to Admission medications   Medication Sig Start Date End Date Taking? Authorizing Provider  HYDROcodone-acetaminophen (NORCO) 5-325 MG tablet Take 1 tablet by mouth every 6 (six) hours as needed for moderate pain. Patient not taking: Reported on 04/06/2016 05/13/15   Shari Kelp, MD  predniSONE (DELTASONE) 10 MG tablet 6 tabs po day 1, 5 tabs po day 2, 4 tabs po day 3, 3 tabs po day 4, 2 tabs po day 5, 1 tab po day 6 Patient not taking: Reported on 04/06/2016 05/13/15   Shari Kelp, MD  valACYclovir (VALTREX) 1000 MG tablet Take 1 tablet (1,000 mg total) by mouth 3 (three) times daily. Patient not taking: Reported on 04/06/2016 05/13/15   Shari Kelp, MD  Venlafaxine HCl 37.5 MG TB24 Take 1 tablet (37.5 mg total) by mouth daily. 04/06/16   Doyle Askew, FNP    Past Medical, Surgical Family and Social History reviewed and updated.    Objective:   Vitals:   04/20/16 0929  BP: 140/82  Pulse: 82  Temp: 98.7 F (37.1 C)     Wt Readings from Last 3 Encounters:  04/06/16 160 lb (72.6 kg)  05/23/15 168 lb (76.2 kg)  05/13/15 160 lb (72.6 kg)    Physical Exam  Constitutional: She is oriented to person, place, and time.  Cardiovascular: Normal rate, regular rhythm, normal heart sounds and intact distal pulses.   Performed multiple EKG  and machine was malfunctioning only capturing artifact. Will defer for later wellness exam. Only obtaining today for baseline reading.  Pulmonary/Chest: Effort normal and breath sounds normal.  Genitourinary: Uterus normal. Uterus is not deviated. Cervix exhibits no motion tenderness, no discharge and no friability. Right adnexum displays no mass, no tenderness and no fullness. Left adnexum displays no mass, no tenderness and no fullness.  Genitourinary Comments: Mildly scant clear discharge present. Negative of lesions   Neurological: She is alert and oriented to person, place,  and time.  Skin: Skin is dry.  Psychiatric: She has a normal mood and affect. Her behavior is normal. Judgment and thought content normal.      Assessment & Plan:  1. Women's annual routine gynecological examination - Cytology - PAP    2. Blood pressure elevated without history of Hypertension Blood pressure persistently elevated in the low to mid 140's systolic during today's visit and at last office visit. Diastolic greater than 80 also. -Start Losartan (Cozaar) 25 mg tablet, daily  -Prescription for blood pressure monitor provided  -Return in 2 months for hypertension recheck.   Godfrey Pick. Tiburcio Pea, MSN, [facility] [address] [place], [place] [address] [phone]

## 2016-04-22 ENCOUNTER — Ambulatory Visit: Payer: Self-pay

## 2016-04-22 LAB — CYTOLOGY - PAP
Adequacy: ABSENT
CHLAMYDIA, DNA PROBE: NEGATIVE
Candida vaginitis: POSITIVE — AB
Diagnosis: NEGATIVE
Neisseria Gonorrhea: NEGATIVE
TRICH (WINDOWPATH): POSITIVE — AB

## 2016-04-22 MED ORDER — FLUCONAZOLE 150 MG PO TABS: 150.0000 mg | ORAL_TABLET | Freq: Once | ORAL | 0 refills | Status: AC

## 2016-04-22 MED ORDER — METRONIDAZOLE 500 MG PO TABS: 500.0000 mg | ORAL_TABLET | Freq: Once | ORAL | 0 refills | Status: AC

## 2016-04-22 NOTE — Addendum Note (Signed)
Addended by: Bing Neighbors on: 04/22/2016 05:00 PM   Modules accepted: Orders

## 2016-05-05 ENCOUNTER — Ambulatory Visit
Admission: RE | Admit: 2016-05-05 | Discharge: 2016-05-05 | Disposition: A | Discharge status: Home / Self Care | Payer: BLUE CROSS/BLUE SHIELD | Source: Ambulatory Visit | Attending: Family Medicine | Admitting: Family Medicine

## 2016-05-05 DIAGNOSIS — Z1231 Encounter for screening mammogram for malignant neoplasm of breast: Secondary | ICD-10-CM | POA: Diagnosis not present

## 2016-05-07 ENCOUNTER — Encounter: Payer: Self-pay | Admitting: Family Medicine

## 2016-05-07 ENCOUNTER — Other Ambulatory Visit: Payer: Self-pay | Admitting: Family Medicine

## 2016-05-07 DIAGNOSIS — R928 Other abnormal and inconclusive findings on diagnostic imaging of breast: Secondary | ICD-10-CM

## 2016-05-12 ENCOUNTER — Ambulatory Visit
Admission: RE | Admit: 2016-05-12 | Discharge: 2016-05-12 | Disposition: A | Discharge status: Home / Self Care | Payer: BLUE CROSS/BLUE SHIELD | Source: Ambulatory Visit | Attending: Family Medicine | Admitting: Family Medicine

## 2016-05-12 ENCOUNTER — Other Ambulatory Visit: Payer: Self-pay | Admitting: Family Medicine

## 2016-05-12 DIAGNOSIS — R921 Mammographic calcification found on diagnostic imaging of breast: Secondary | ICD-10-CM | POA: Diagnosis not present

## 2016-05-12 DIAGNOSIS — R928 Other abnormal and inconclusive findings on diagnostic imaging of breast: Secondary | ICD-10-CM

## 2016-06-22 ENCOUNTER — Ambulatory Visit (INDEPENDENT_AMBULATORY_CARE_PROVIDER_SITE_OTHER): Payer: BLUE CROSS/BLUE SHIELD | Admitting: Family Medicine

## 2016-06-22 DIAGNOSIS — Z719 Counseling, unspecified: Secondary | ICD-10-CM

## 2016-06-22 MED ORDER — LOSARTAN POTASSIUM 25 MG PO TABS: 50.0000 mg | ORAL_TABLET | Freq: Every day | ORAL | 1 refills | Status: DC

## 2016-06-22 NOTE — Patient Instructions (Addendum)
Increase losartan 50 mg once daily.     Hypertension Hypertension is another name for high blood pressure. High blood pressure forces your heart to work harder to pump blood. This can cause problems over time. There are two numbers in a blood pressure reading. There is a top number (systolic) over a bottom number (diastolic). It is best to have a blood pressure below 120/80. Healthy choices can help lower your blood pressure. You may need medicine to help lower your blood pressure if:  Your blood pressure cannot be lowered with healthy choices.  Your blood pressure is higher than 130/80.  Follow these instructions at home: Eating and drinking  If directed, follow the DASH eating plan. This diet includes: ? Filling half of your plate at each meal with fruits and vegetables. ? Filling one quarter of your plate at each meal with whole grains. Whole grains include whole wheat pasta, brown rice, and whole grain bread. ? Eating or drinking low-fat dairy products, such as skim milk or low-fat yogurt. ? Filling one quarter of your plate at each meal with low-fat (lean) proteins. Low-fat proteins include fish, skinless chicken, eggs, beans, and tofu. ? Avoiding fatty meat, cured and processed meat, or chicken with skin. ? Avoiding premade or processed food.  Eat less than 1,500 mg of salt (sodium) a day.  Limit alcohol use to no more than 1 drink a day for nonpregnant women and 2 drinks a day for men. One drink equals 12 oz of beer, 5 oz of wine, or 1 oz of hard liquor. Lifestyle  Work with your doctor to stay at a healthy weight or to lose weight. Ask your doctor what the best weight is for you.  Get at least 30 minutes of exercise that causes your heart to beat faster (aerobic exercise) most days of the week. This may include walking, swimming, or biking.  Get at least 30 minutes of exercise that strengthens your muscles (resistance exercise) at least 3 days a week. This may include lifting  weights or pilates.  Do not use any products that contain nicotine or tobacco. This includes cigarettes and e-cigarettes. If you need help quitting, ask your doctor.  Check your blood pressure at home as told by your doctor.  Keep all follow-up visits as told by your doctor. This is important. Medicines  Take over-the-counter and prescription medicines only as told by your doctor. Follow directions carefully.  Do not skip doses of blood pressure medicine. The medicine does not work as well if you skip doses. Skipping doses also puts you at risk for problems.  Ask your doctor about side effects or reactions to medicines that you should watch for. Contact a doctor if:  You think you are having a reaction to the medicine you are taking.  You have headaches that keep coming back (recurring).  You feel dizzy.  You have swelling in your ankles.  You have trouble with your vision. Get help right away if:  You get a very bad headache.  You start to feel confused.  You feel weak or numb.  You feel faint.  You get very bad pain in your: ? Chest. ? Belly (abdomen).  You throw up (vomit) more than once.  You have trouble breathing. Summary  Hypertension is another name for high blood pressure.  Making healthy choices can help lower blood pressure. If your blood pressure cannot be controlled with healthy choices, you may need to take medicine. This information is not intended  to replace advice given to you by your health care provider. Make sure you discuss any questions you have with your health care provider. Document Released: 06/23/2007 Document Revised: 12/03/2015 Document Reviewed: 12/03/2015 Elsevier Interactive Patient Education  [employer].

## 2016-06-27 NOTE — Progress Notes (Signed)
Increased blood pressure medication due to BP is not at goal today. Return for follow-up in 1-2 weeks.

## 2016-06-30 ENCOUNTER — Ambulatory Visit: Payer: Self-pay | Admitting: Family Medicine

## 2016-07-12 ENCOUNTER — Ambulatory Visit (INDEPENDENT_AMBULATORY_CARE_PROVIDER_SITE_OTHER): Payer: BLUE CROSS/BLUE SHIELD | Admitting: Family Medicine

## 2016-07-12 ENCOUNTER — Encounter: Payer: Self-pay | Admitting: Family Medicine

## 2016-07-12 DIAGNOSIS — I10 Essential (primary) hypertension: Secondary | ICD-10-CM | POA: Insufficient documentation

## 2016-07-12 DIAGNOSIS — R232 Flushing: Secondary | ICD-10-CM | POA: Diagnosis not present

## 2016-07-12 MED ORDER — VENLAFAXINE HCL 75 MG PO TABS: 75.0000 mg | ORAL_TABLET | Freq: Two times a day (BID) | ORAL | 2 refills | Status: DC

## 2016-07-12 MED ORDER — LOSARTAN POTASSIUM 50 MG PO TABS: 50.0000 mg | ORAL_TABLET | Freq: Every day | ORAL | 1 refills | Status: DC

## 2016-07-12 NOTE — Patient Instructions (Signed)
I have increased your Venlafaxine backfilling from 37.5 mg once daily to 75 mg twice daily.  Continue losartan 50 mg once daily.   Return for follow-up in 6 months for blood pressure recheck and evaluation of her hot flashes.  If you need [place] sooner please call and schedule an appointment..       Hypertension Hypertension is another name for high blood pressure. High blood pressure forces your heart to work harder to pump blood. This can cause problems over time. There are two numbers in a blood pressure reading. There is a top number (systolic) over a bottom number (diastolic). It is best to have a blood pressure below 120/80. Healthy choices can help lower your blood pressure. You may need medicine to help lower your blood pressure if:  Your blood pressure cannot be lowered with healthy choices.  Your blood pressure is higher than 130/80.  Follow these instructions at home: Eating and drinking  If directed, follow the DASH eating plan. This diet includes: ? Filling half of your plate at each meal with fruits and vegetables. ? Filling one quarter of your plate at each meal with whole grains. Whole grains include whole wheat pasta, brown rice, and whole grain bread. ? Eating or drinking low-fat dairy products, such as skim milk or low-fat yogurt. ? Filling one quarter of your plate at each meal with low-fat (lean) proteins. Low-fat proteins include fish, skinless chicken, eggs, beans, and tofu. ? Avoiding fatty meat, cured and processed meat, or chicken with skin. ? Avoiding premade or processed food.  Eat less than 1,500 mg of salt (sodium) a day.  Limit alcohol use to no more than 1 drink a day for nonpregnant women and 2 drinks a day for men. One drink equals 12 oz of beer, 5 oz of wine, or 1 oz of hard liquor. Lifestyle  Work with your doctor to stay at a healthy weight or to lose weight. Ask your doctor what the best weight is for you.  Get at least 30 minutes of exercise  that causes your heart to beat faster (aerobic exercise) most days of the week. This may include walking, swimming, or biking.  Get at least 30 minutes of exercise that strengthens your muscles (resistance exercise) at least 3 days a week. This may include lifting weights or pilates.  Do not use any products that contain nicotine or tobacco. This includes cigarettes and e-cigarettes. If you need help quitting, ask your doctor.  Check your blood pressure at home as told by your doctor.  Keep all follow-up visits as told by your doctor. This is important. Medicines  Take over-the-counter and prescription medicines only as told by your doctor. Follow directions carefully.  Do not skip doses of blood pressure medicine. The medicine does not work as well if you skip doses. Skipping doses also puts you at risk for problems.  Ask your doctor about side effects or reactions to medicines that you should watch for. Contact a doctor if:  You think you are having a reaction to the medicine you are taking.  You have headaches that keep coming back (recurring).  You feel dizzy.  You have swelling in your ankles.  You have trouble with your vision. Get help right away if:  You get a very bad headache.  You start to feel confused.  You feel weak or numb.  You feel faint.  You get very bad pain in your: ? Chest. ? Belly (abdomen).  You throw up (  vomit) more than once.  You have trouble breathing. Summary  Hypertension is another name for high blood pressure.  Making healthy choices can help lower blood pressure. If your blood pressure cannot be controlled with healthy choices, you may need to take medicine. This information is not intended to replace advice given to you by your health care provider. Make sure you discuss any questions you have with your health care provider. Document Released: 06/23/2007 Document Revised: 12/03/2015 Document Reviewed: 12/03/2015 Elsevier Interactive  Patient Education  [employer]2018 Elsevier Inc.

## 2016-07-12 NOTE — Progress Notes (Signed)
Patient ID: Shari Gonzales, female    DOB: 01/17/1961, 56 y.o.   MRN: [id number]  PCP: Bing NeighborsHarris, Theodosia Bahena S, FNP  Chief Complaint  Patient presents with  . Follow-up    Blood pressure, hot flashes    Subjective:  HPI  Shari Gonzales is a 56 y.o. female presents for evaluation of Hypertension and hot flashes.  Blood Pressure  Shari Gonzales reports no home monitoring of blood pressure. Reports adherence to blood pressure medications.  During a recent blood pressure check her blood pressure was elevated and losartan was increased from 25 mg daily to 50 mg daily. Reports efforts to adhere to low sodium diet. She is a nonsmoker. Denies any episodes of dizziness, headaches, shortness of breath, or chest pain.  Hot flashes   Shari Gonzales reports that her hot flashes have returned. Initially when she started Venlafaxine her symptoms remitted. She reports now on their current more frequently and at some points daily. She requests an increase in dosage of  venlafaxine.  Social History   Social History  . Marital status: Single    Spouse name: N/A  . Number of children: N/A  . Years of education: N/A   Occupational History  . Not on file.   Social History Main Topics  . Smoking status: Never Smoker  . Smokeless tobacco: Never Used  . Alcohol use No  . Drug use: No  . Sexual activity: Not on file   Other Topics Concern  . Not on file   Social History Narrative  . No narrative on file    History reviewed. No pertinent family history.   Review of Systems See history of present illness Patient Active Problem List   Diagnosis Date Noted  . Cervical strain 05/14/2015  . Lumbar strain 05/14/2015  . Concussion with loss of consciousness 05/14/2015  . Rash and nonspecific skin eruption 05/14/2015  . Injury of left shoulder 11/26/2014    No Known Allergies  Prior to Admission medications   Medication Sig Start Date End Date Taking? Authorizing Provider   HYDROcodone-acetaminophen (NORCO) 5-325 MG tablet Take 1 tablet by mouth every 6 (six) hours as needed for moderate pain. 05/13/15  Yes Hudnall, Azucena FallenShane R, MD  losartan (COZAAR) 25 MG tablet Take 2 tablets (50 mg total) by mouth daily. 06/22/16  Yes Bing NeighborsHarris, Jentry Warnell S, FNP  valACYclovir (VALTREX) 1000 MG tablet Take 1 tablet (1,000 mg total) by mouth 3 (three) times daily. 05/13/15  Yes Hudnall, Azucena FallenShane R, MD  Venlafaxine HCl 37.5 MG TB24 Take 1 tablet (37.5 mg total) by mouth daily. 04/20/16  Yes Bing NeighborsHarris, Nori Poland S, FNP    Past Medical, Surgical Family and Social History reviewed and updated.    Objective:   Today's Vitals   07/12/16 0915  BP: (!) 146/86  Pulse: 73  Resp: 16  Temp: 98.3 F (36.8 C)  TempSrc: Oral  SpO2: 100%  Weight: 157 lb (71.2 kg)  Height: 5\' 5"  (1.651 m)    Wt Readings from Last 3 Encounters:  07/12/16 157 lb (71.2 kg)  04/20/16 158 lb (71.7 kg)  04/06/16 160 lb (72.6 kg)    Physical Exam  Constitutional: She is oriented to person, place, and time. She appears well-developed and well-nourished.  HENT:  Head: Normocephalic and atraumatic.  Eyes: Conjunctivae and EOM are normal. Pupils are equal, round, and reactive to light.  Cardiovascular: Normal rate, normal heart sounds and intact distal pulses.   Pulmonary/Chest: Effort normal and breath sounds normal.  Neurological: She is alert  and oriented to person, place, and time.  Skin: Skin is warm and dry.  Psychiatric: She has a normal mood and affect. Her behavior is normal. Judgment and thought content normal.    Assessment & Plan:  1. Essential hypertension, Stable -Continue losartan 50 mg daily -Increase physical activity to a minimum of 150 minutes per week of vigorous activity -Continue to make efforts to reduce sodium intake   2. Hot flashes -I am increasing her venlafaxine to 75 mg 2 times daily with meals. She is to notify me if the medication is not tolerable at this dose.  RTC: 6 months for  chronic disease management.   Godfrey Pick. Tiburcio Pea, MSN, FNP-C [facility]  [address] [place], [place] [address] [phone]

## 2016-10-12 ENCOUNTER — Other Ambulatory Visit: Payer: Self-pay | Admitting: Family Medicine

## 2016-10-12 DIAGNOSIS — R921 Mammographic calcification found on diagnostic imaging of breast: Secondary | ICD-10-CM

## 2016-10-13 ENCOUNTER — Encounter: Payer: Self-pay | Admitting: Family Medicine

## 2016-10-13 ENCOUNTER — Ambulatory Visit (INDEPENDENT_AMBULATORY_CARE_PROVIDER_SITE_OTHER): Payer: BLUE CROSS/BLUE SHIELD | Admitting: Family Medicine

## 2016-10-13 VITALS — BP 140/72 | HR 75 | Temp 98.1°F | Resp 14 | Ht 65.0 in | Wt 152.6 lb

## 2016-10-13 DIAGNOSIS — J069 Acute upper respiratory infection, unspecified: Secondary | ICD-10-CM | POA: Diagnosis not present

## 2016-10-13 MED ORDER — IPRATROPIUM BROMIDE 0.03 % NA SOLN: 2.0000 | Freq: Two times a day (BID) | NASAL | 0 refills | Status: DC

## 2016-10-13 MED ORDER — CETIRIZINE HCL 10 MG PO TABS: 10.0000 mg | ORAL_TABLET | Freq: Every day | ORAL | 11 refills | Status: DC

## 2016-10-13 MED ORDER — PROMETHAZINE-CODEINE 6.25-10 MG/5ML PO SYRP: 5.0000 mL | ORAL_SOLUTION | Freq: Four times a day (QID) | ORAL | 0 refills | Status: DC | PRN

## 2016-10-13 NOTE — Patient Instructions (Signed)
Take medication as prescribed. If no improvement of symptoms by Friday, please contact me here at the office for consideration of antibiotic therapy. Hydrate, rest, and practice good handwashing.     Upper Respiratory Infection, Adult Most upper respiratory infections (URIs) are caused by a virus. A URI affects the nose, throat, and upper air passages. The most common type of URI is often called "the common cold." Follow these instructions at home:  Take medicines only as told by your doctor.  Gargle warm saltwater or take cough drops to comfort your throat as told by your doctor.  Use a warm mist humidifier or inhale steam from a shower to increase air moisture. This may make it easier to breathe.  Drink enough fluid to keep your pee (urine) clear or pale yellow.  Eat soups and other clear broths.  Have a healthy diet.  Rest as needed.  Go back to work when your fever is gone or your doctor says it is okay. ? You may need to stay home longer to avoid giving your URI to others. ? You can also wear a face mask and wash your hands often to prevent spread of the virus.  Use your inhaler more if you have asthma.  Do not use any tobacco products, including cigarettes, chewing tobacco, or electronic cigarettes. If you need help quitting, ask your doctor. Contact a doctor if:  You are getting worse, not better.  Your symptoms are not helped by medicine.  You have chills.  You are getting more short of breath.  You have brown or red mucus.  You have yellow or brown discharge from your nose.  You have pain in your face, especially when you bend forward.  You have a fever.  You have puffy (swollen) neck glands.  You have pain while swallowing.  You have white areas in the back of your throat. Get help right away if:  You have very bad or constant: ? Headache. ? Ear pain. ? Pain in your forehead, behind your eyes, and over your cheekbones (sinus pain). ? Chest  pain.  You have long-lasting (chronic) lung disease and any of the following: ? Wheezing. ? Long-lasting cough. ? Coughing up blood. ? A change in your usual mucus.  You have a stiff neck.  You have changes in your: ? Vision. ? Hearing. ? Thinking. ? Mood. This information is not intended to replace advice given to you by your health care provider. Make sure you discuss any questions you have with your health care provider. Document Released: 06/23/2007 Document Revised: 09/07/2015 Document Reviewed: 04/11/2013 Elsevier Interactive Patient Education  2018 [employer].

## 2016-10-13 NOTE — Progress Notes (Signed)
Patient ID: Shari Gonzales, female    DOB: [birth year], 56 y.o.   MRN: [id number]  PCP: Bing Neighbors, FNP  Chief Complaint  Patient presents with  . Cough  . Nasal Congestion    Subjective:  HPI Shari Gonzales is a 56 y.o. female presents for evaluation of cough and nasal congestion x 2 days.  She reports no headache, fever, shortness of breath, wheezing, nausea, or vomiting. Reports associated cough with phlegm production,  scratchy throat, and nasal congestion. No shortness of breath or wheezing. Attempted relief with Nyquil without relief of symptoms.  Social History   Social History  . Marital status: Single    Spouse name: N/A  . Number of children: N/A  . Years of education: N/A   Occupational History  . Not on file.   Social History Main Topics  . Smoking status: Never Smoker  . Smokeless tobacco: Never Used  . Alcohol use No  . Drug use: No  . Sexual activity: Not on file   Other Topics Concern  . Not on file   Social History Narrative  . No narrative on file    Family History  Problem Relation Age of Onset  . Family history unknown: Yes   Review of Systems See HPI Patient Active Problem List   Diagnosis Date Noted  . HTN (hypertension) 07/12/2016  . Hot flashes 07/12/2016  . Concussion with loss of consciousness 05/14/2015    No Known Allergies  Prior to Admission medications   Medication Sig Start Date End Date Taking? Authorizing Provider  HYDROcodone-acetaminophen (NORCO) 5-325 MG tablet Take 1 tablet by mouth every 6 (six) hours as needed for moderate pain. 05/13/15  Yes Hudnall, Azucena Fallen, MD  losartan (COZAAR) 50 MG tablet Take 1 tablet (50 mg total) by mouth daily. 07/12/16  Yes Bing Neighbors, FNP  valACYclovir (VALTREX) 1000 MG tablet Take 1 tablet (1,000 mg total) by mouth 3 (three) times daily. 05/13/15  Yes Hudnall, Azucena Fallen, MD  venlafaxine (EFFEXOR) 75 MG tablet Take 1 tablet (75 mg total) by mouth 2 (two) times daily with a  meal. 07/12/16  Yes Bing Neighbors, FNP    Past Medical, Surgical Family and Social History reviewed and updated.    Objective:   Today's Vitals   10/13/16 0800  BP: 140/72  Pulse: 75  Resp: 14  Temp: 98.1 F (36.7 C)  TempSrc: Oral  SpO2: 99%  Weight: 152 lb 9.6 oz (69.2 kg)  Height:  (1.651 m)    Wt Readings from Last 3 Encounters:  10/13/16 152 lb 9.6 oz (69.2 kg)  07/12/16 157 lb (71.2 kg)  04/20/16 158 lb (71.7 kg)   Physical Exam  Constitutional: She is oriented to person, place, and time. She appears well-developed and well-nourished.  HENT:  Head: Normocephalic and atraumatic.  Eyes: Pupils are equal, round, and reactive to light. Conjunctivae and EOM are normal.  Neck: Normal range of motion. Neck supple.  Cardiovascular: Normal rate, regular rhythm, normal heart sounds and intact distal pulses.   Pulmonary/Chest: Effort normal and breath sounds normal.  Neurological: She is alert and oriented to person, place, and time.  Skin: Skin is warm and dry.  Psychiatric: She has a normal mood and affect. Her behavior is normal. Judgment and thought content normal.   Assessment & Plan:  1. Upper respiratory tract infection, unspecified type, treating symptomatically for now, if no improvement within 5 days, will consider antibiotic therapy.  Meds ordered this encounter  Medications  . cetirizine (ZYRTEC) 10 MG tablet    Sig: Take 1 tablet (10 mg total) by mouth daily.    Dispense:  30 tablet    Refill:  11    Order Specific Question:   Supervising Provider    Answer:   Quentin Angst [id number]  . ipratropium (ATROVENT) 0.03 % nasal spray    Sig: Place 2 sprays into both nostrils 2 (two) times daily.    Dispense:  30 mL    Refill:  0    Order Specific Question:   Supervising Provider    Answer:   Quentin Angst [id number]  . promethazine-codeine (PHENERGAN WITH CODEINE) 6.25-10 MG/5ML syrup    Sig: Take 5 mLs by mouth every 6 (six) hours as  needed for cough.    Dispense:  120 mL    Refill:  0    Order Specific Question:   Supervising Provider    Answer:   Quentin Angst [id number]   RTC: As scheduled or sooner if needed.    Godfrey Pick. Tiburcio Pea, MSN, FNP-C [facility]  [address] [place], [place] [address] [phone]

## 2016-11-17 ENCOUNTER — Inpatient Hospital Stay: Admission: RE | Admit: 2016-11-17 | Payer: Self-pay | Source: Ambulatory Visit

## 2017-01-17 ENCOUNTER — Ambulatory Visit: Payer: Self-pay | Admitting: Family Medicine

## 2017-04-29 IMAGING — MG DIGITAL SCREENING BILATERAL MAMMOGRAM WITH CAD
4 series · 4 of 4 positions shown · non-contrast
Comparison: Previous exam(s).

ACR Breast Density Category a: The breast tissue is almost entirely
fatty.

CLINICAL DATA: Screening.

EXAM:
DIGITAL SCREENING BILATERAL MAMMOGRAM WITH CAD

[R MLO]
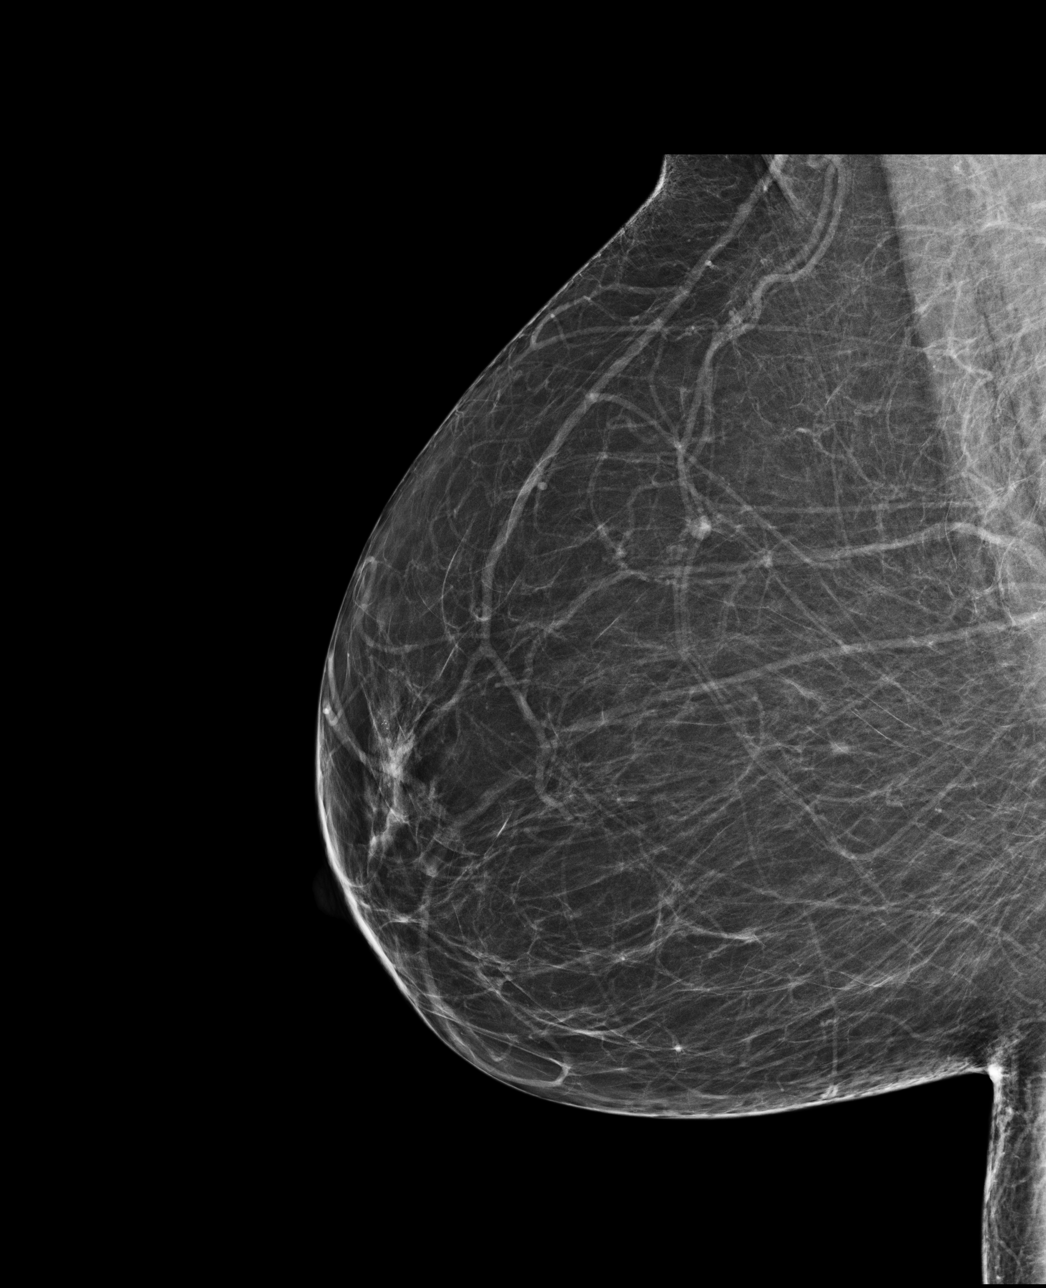

[R CC]
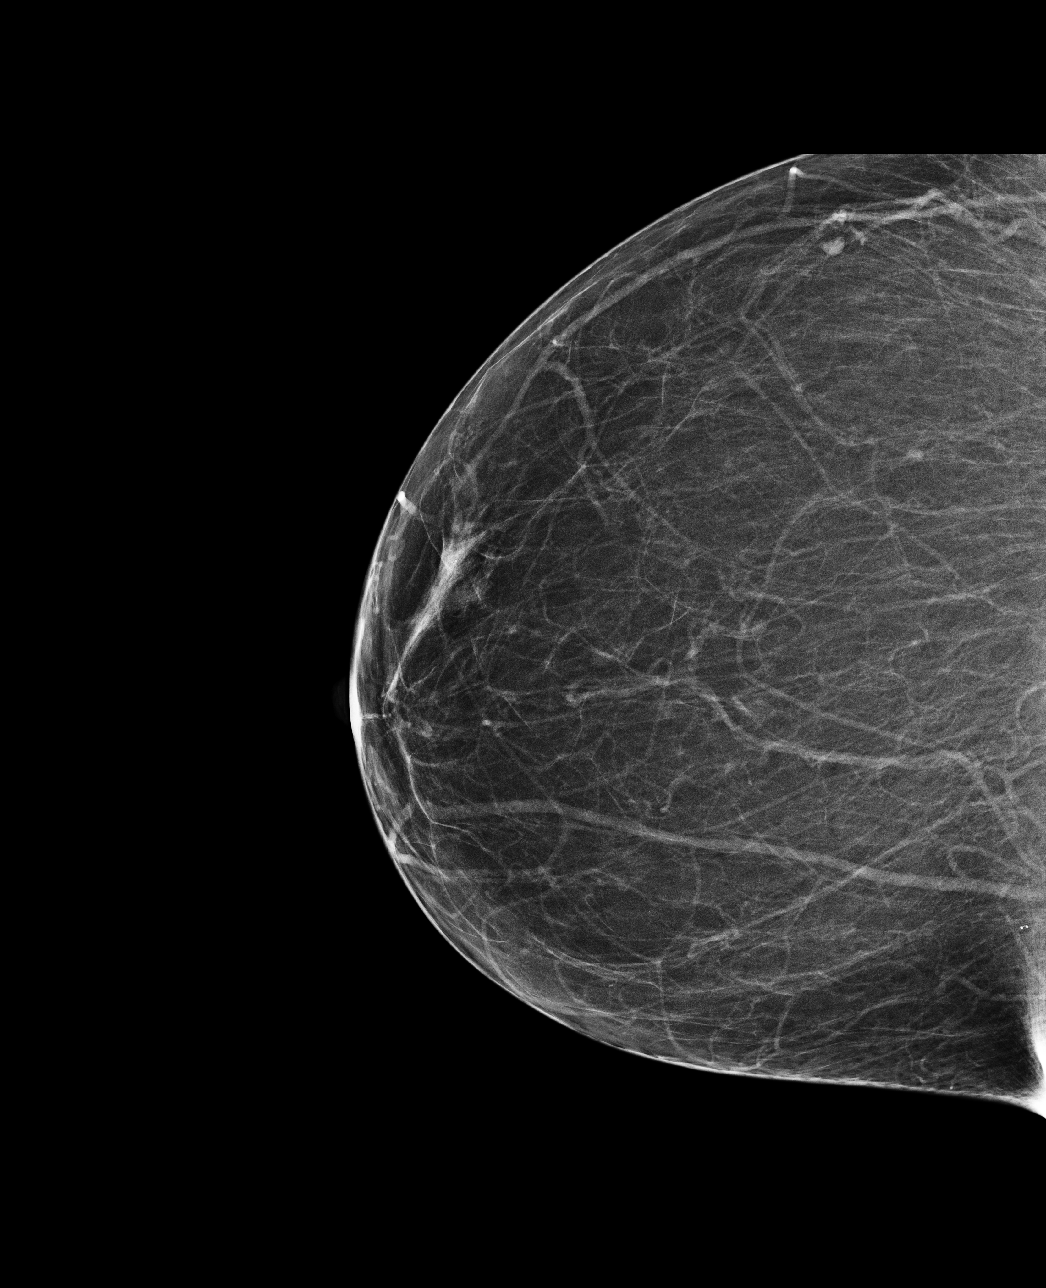

[L MLO]
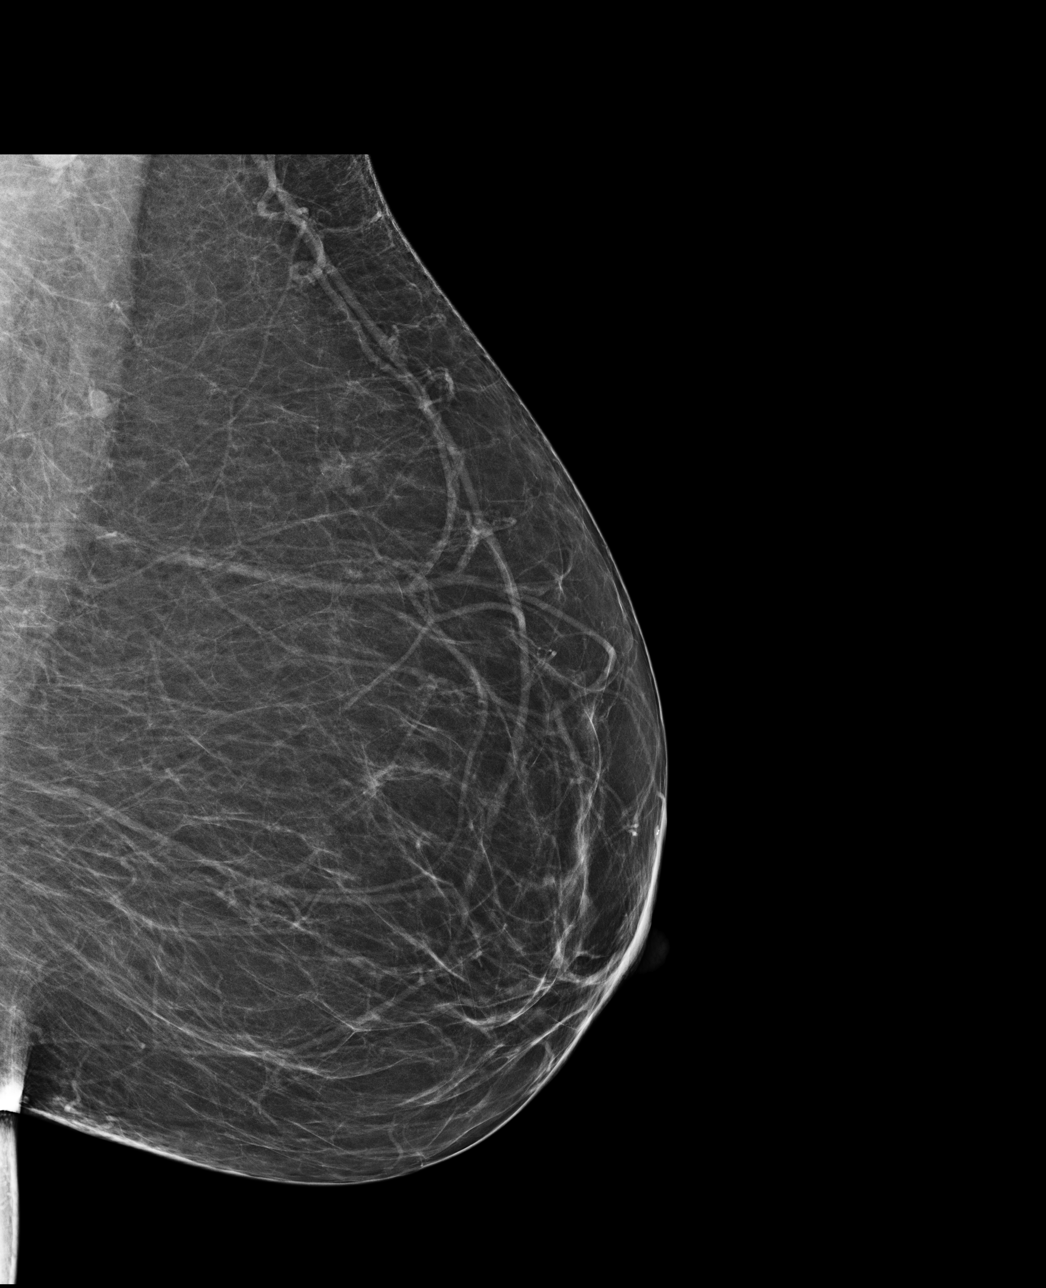

[L CC]
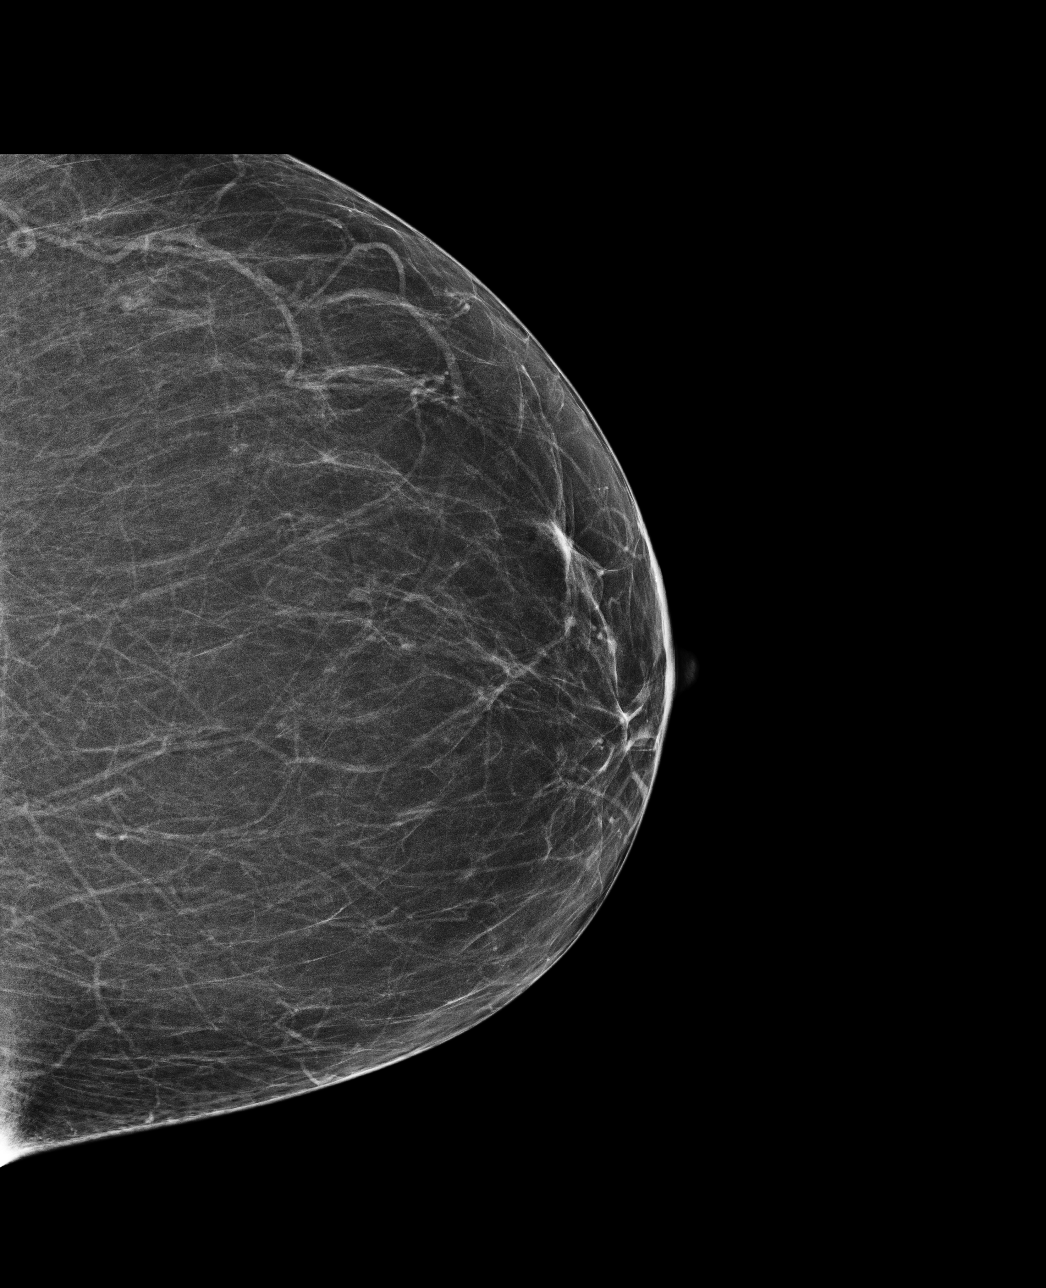

[4 of 4 positions shown; findings below may reference images not displayed]

FINDINGS: In the right breast microcalcifications requires further evaluation.

In the left breast a possible asymmetry with microcalcifications
requires further evaluation.

Images were processed with CAD.
IMPRESSION: Further evaluation is suggested for possible microcalcifications in
the right breast.

Further evaluation is suggested for possible asymmetry with
microcalcifications in the left breast.

RECOMMENDATION:
Diagnostic mammogram and possibly ultrasound of both breasts.
(Code:C5-4-ZZI)

The patient will be contacted regarding the findings, and additional
imaging will be scheduled.

BI-RADS CATEGORY  0: Incomplete. Need additional imaging evaluation
and/or prior mammograms for comparison.

## 2017-10-05 ENCOUNTER — Emergency Department (HOSPITAL_COMMUNITY)
Admission: EM | Admit: 2017-10-05 | Discharge: 2017-10-05 | Disposition: A | Discharge status: Home / Self Care | Payer: BLUE CROSS/BLUE SHIELD | Attending: Emergency Medicine | Admitting: Emergency Medicine

## 2017-10-05 ENCOUNTER — Encounter (HOSPITAL_COMMUNITY): Payer: Self-pay

## 2017-10-05 ENCOUNTER — Other Ambulatory Visit: Payer: Self-pay

## 2017-10-05 DIAGNOSIS — K0889 Other specified disorders of teeth and supporting structures: Secondary | ICD-10-CM | POA: Diagnosis not present

## 2017-10-05 DIAGNOSIS — K047 Periapical abscess without sinus: Secondary | ICD-10-CM | POA: Insufficient documentation

## 2017-10-05 DIAGNOSIS — K029 Dental caries, unspecified: Secondary | ICD-10-CM | POA: Insufficient documentation

## 2017-10-05 DIAGNOSIS — I10 Essential (primary) hypertension: Secondary | ICD-10-CM | POA: Diagnosis not present

## 2017-10-05 MED ORDER — IBUPROFEN 200 MG PO TABS
600.0000 mg | ORAL_TABLET | Freq: Once | ORAL | Status: AC
  Administered 2017-10-05: 600 mg via ORAL

## 2017-10-05 MED ORDER — CLINDAMYCIN HCL 150 MG PO CAPS: 300.0000 mg | ORAL_CAPSULE | Freq: Three times a day (TID) | ORAL | 0 refills | Status: AC

## 2017-10-05 MED ORDER — CLINDAMYCIN HCL 300 MG PO CAPS
300.0000 mg | ORAL_CAPSULE | Freq: Once | ORAL | Status: AC
  Administered 2017-10-05: 300 mg via ORAL

## 2017-10-05 NOTE — ED Triage Notes (Signed)
Pt states she noticed lower left sided dental pain today. Pt states she has not been to the dentist lately.

## 2017-10-05 NOTE — ED Provider Notes (Signed)
[facility]-EMERGENCY DEPT Provider Note   CSN: [id number] Arrival date & time: 10/05/17  [id number]     History   Chief Complaint Chief Complaint  Patient presents with  . Dental Pain    HPI Shari Gonzales is a 57 y.o. female.  57 year old female with past medical history below who presents with dental pain.  Yesterday evening, she began having pain and swelling in her left lower molar.  Pain and swelling of progressively worsened.  No breathing problems or problems swallowing.  She has previous history of dental extraction but has not seen a dentist recently.  No medications prior to arrival.  The history is provided by the patient.  Dental Pain      Past Medical History:  Diagnosis Date  . Hypertension   . Seizures [facility](HCC)     Patient Active Problem List   Diagnosis Date Noted  . HTN (hypertension) 07/12/2016  . Hot flashes 07/12/2016  . Concussion with loss of consciousness 05/14/2015    History reviewed. No pertinent surgical history.   OB History   None      Home Medications    Prior to Admission medications   Medication Sig Start Date End Date Taking? Authorizing Provider  cetirizine (ZYRTEC) 10 MG tablet Take 1 tablet (10 mg total) by mouth daily. 10/13/16   Bing NeighborsHarris, Kimberly S, FNP  clindamycin (CLEOCIN) 150 MG capsule Take 2 capsules (300 mg total) by mouth 3 (three) times daily for 7 days. May dispense as 150mg  capsules 10/05/17 10/12/17  Little, Ambrose Finlandachel Morgan, MD  HYDROcodone-acetaminophen (NORCO) 5-325 MG tablet Take 1 tablet by mouth every 6 (six) hours as needed for moderate pain. 05/13/15   Hudnall, Azucena FallenShane R, MD  ipratropium (ATROVENT) 0.03 % nasal spray Place 2 sprays into both nostrils 2 (two) times daily. 10/13/16   Bing NeighborsHarris, Kimberly S, FNP  losartan (COZAAR) 50 MG tablet Take 1 tablet (50 mg total) by mouth daily. 07/12/16   Bing NeighborsHarris, Kimberly S, FNP  promethazine-codeine (PHENERGAN WITH CODEINE) 6.25-10 MG/5ML syrup Take 5 mLs by mouth  every 6 (six) hours as needed for cough. 10/13/16   Bing NeighborsHarris, Kimberly S, FNP  valACYclovir (VALTREX) 1000 MG tablet Take 1 tablet (1,000 mg total) by mouth 3 (three) times daily. 05/13/15   Lenda KelpHudnall, Shane R, MD  venlafaxine (EFFEXOR) 75 MG tablet Take 1 tablet (75 mg total) by mouth 2 (two) times daily with a meal. 07/12/16   Bing NeighborsHarris, Kimberly S, FNP    Family History Family History  Family history unknown: Yes    Social History Social History   Tobacco Use  . Smoking status: Never Smoker  . Smokeless tobacco: Never Used  Substance Use Topics  . Alcohol use: No    Alcohol/week: 0.0 standard drinks  . Drug use: No     Allergies   Patient has no known allergies.   Review of Systems Review of Systems  Constitutional: Negative for fever.  HENT: Positive for dental problem and facial swelling. Negative for trouble swallowing.   Respiratory: Negative for shortness of breath.   Skin: Negative for color change and rash.     Physical Exam Updated Vital Signs BP (!) 153/89 (BP Location: Left Arm)   Pulse 87   Temp 98.5 F (36.9 C) (Oral)   Resp 16   Ht 5' (1.524 m)   Wt 72.6 kg   SpO2 100%   BMI 31.25 kg/m   Physical Exam  Constitutional: She is oriented to person, place, and time. She  appears well-developed and well-nourished. No distress.  HENT:  Head: Normocephalic.  Poor dentition with multiple dental caries and decay along gumline of L lower molars; focal area of edema and tendenress along L lower mandible, no problems with floor of mouth, no trismus, oropharynx clear  Eyes: Conjunctivae are normal.  Neck: Neck supple.  Neurological: She is alert and oriented to person, place, and time.  Skin: Skin is warm and dry.  Psychiatric: She has a normal mood and affect. Judgment normal.  Nursing note and vitals reviewed.    ED Treatments / Results  Labs (all labs ordered are listed, but only abnormal results are displayed) Labs Reviewed - No data to  display  EKG None  Radiology No results found.  Procedures Procedures (including critical care time)  Medications Ordered in ED Medications  clindamycin (CLEOCIN) capsule 300 mg (has no administration in time range)     Initial Impression / Assessment and Plan / ED Course  I have reviewed the triage vital signs and the nursing notes.      Exam c/w dental abscess, no obvious fluid collection that I can drain here.  For size the importance of getting to an oral surgeon as soon as possible for definitive management.  Will start on clindamycin as a temporizing measure.  She understands that the clindamycin will not resolve the problem completely.  Discussed supportive measures for pain control at home.  Extensively reviewed return precautions including swallowing problems, breathing problems, or severe worsening symptoms.  She voiced understanding. Final Clinical Impressions(s) / ED Diagnoses   Final diagnoses:  Dental abscess    ED Discharge Orders         Ordered    clindamycin (CLEOCIN) 150 MG capsule  3 times daily     10/05/17 1037           Little, Ambrose Finland, MD 10/05/17 1041

## 2018-03-03 ENCOUNTER — Telehealth: Payer: Self-pay

## 2018-03-03 ENCOUNTER — Ambulatory Visit (INDEPENDENT_AMBULATORY_CARE_PROVIDER_SITE_OTHER): Payer: BLUE CROSS/BLUE SHIELD | Admitting: Family Medicine

## 2018-03-03 VITALS — BP 124/88 | HR 76 | Temp 98.0°F | Ht 60.0 in | Wt 152.0 lb

## 2018-03-03 DIAGNOSIS — Z0000 Encounter for general adult medical examination without abnormal findings: Secondary | ICD-10-CM

## 2018-03-03 DIAGNOSIS — Z23 Encounter for immunization: Secondary | ICD-10-CM

## 2018-03-03 DIAGNOSIS — H6123 Impacted cerumen, bilateral: Secondary | ICD-10-CM

## 2018-03-03 DIAGNOSIS — I10 Essential (primary) hypertension: Secondary | ICD-10-CM | POA: Diagnosis not present

## 2018-03-03 DIAGNOSIS — K029 Dental caries, unspecified: Secondary | ICD-10-CM | POA: Diagnosis not present

## 2018-03-03 DIAGNOSIS — Z131 Encounter for screening for diabetes mellitus: Secondary | ICD-10-CM

## 2018-03-03 DIAGNOSIS — Z09 Encounter for follow-up examination after completed treatment for conditions other than malignant neoplasm: Secondary | ICD-10-CM

## 2018-03-03 LAB — POCT GLYCOSYLATED HEMOGLOBIN (HGB A1C): Hemoglobin A1C: 5.1 % (ref 4.0–5.6)

## 2018-03-03 LAB — POCT URINALYSIS DIP (MANUAL ENTRY)
Bilirubin, UA: NEGATIVE
Blood, UA: NEGATIVE
Glucose, UA: NEGATIVE mg/dL
Ketones, POC UA: NEGATIVE mg/dL
Leukocytes, UA: NEGATIVE
Nitrite, UA: NEGATIVE
Spec Grav, UA: 1.025 (ref 1.010–1.025)
Urobilinogen, UA: 0.2 E.U./dL
pH, UA: 5.5 (ref 5.0–8.0)

## 2018-03-03 NOTE — Telephone Encounter (Signed)
Patient states that she will go to the dentist that pulled her teeth before

## 2018-03-03 NOTE — Progress Notes (Signed)
[facility]  Re-establish Care--Sick Visit  Subjective:  Patient ID: Shari Gonzales, female    DOB: 1960/11/14  Age: 58 y.o. MRN: [id number]  CC:  Chief Complaint  Patient presents with  . Establish Care   HPI Shari Gonzales is a 58 year old female who presents for Follow Up today.   Past Medical History:  Diagnosis Date  . Hyperkalemia   . Hyperlipidemia   . Hypertension   . Seizures (HCC)   . Vitamin D deficiency     Current Status: Since her last office visit, she has had nasal congestion and sinus pressure X 1 week. She believes that she had a mild cold, but her symptoms are improving. She has been taking OTC cold medications, drinking orange juice, and drinking plenty of water. She denies visual changes, chest pain, cough, shortness of breath, heart palpitations, and falls. She has occasional headaches and dizziness with position changes. Denies severe headaches, confusion, seizures, double vision, and blurred vision, nausea and vomiting.  She denies fevers, chills, fatigue, recent infections, weight loss, and night sweats. No reports of GI problems such as diarrhea, and constipation. She has no reports of blood in stools, dysuria and hematuria. No depression or anxiety reported. She denies pain today.   No past surgical history on file.  Family History  Family history unknown: Yes    Social History   Socioeconomic History  . Marital status: Single    Spouse name: Not on file  . Number of children: Not on file  . Years of education: Not on file  . Highest education level: Not on file  Occupational History  . Not on file  Social Needs  . Financial resource strain: Not on file  . Food insecurity:    Worry: Not on file    Inability: Not on file  . Transportation needs:    Medical: Not on file    Non-medical: Not on file  Tobacco Use  . Smoking status: Never Smoker  . Smokeless tobacco: Never Used  Substance  and Sexual Activity  . Alcohol use: No    Alcohol/week: 0.0 standard drinks  . Drug use: No  . Sexual activity: Not on file  Lifestyle  . Physical activity:    Days per week: Not on file    Minutes per session: Not on file  . Stress: Not on file  Relationships  . Social connections:    Talks on phone: Not on file    Gets together: Not on file    Attends religious service: Not on file    Active member of club or organization: Not on file    Attends meetings of clubs or organizations: Not on file    Relationship status: Not on file  . Intimate partner violence:    Fear of current or ex partner: Not on file    Emotionally abused: Not on file    Physically abused: Not on file    Forced sexual activity: Not on file  Other Topics Concern  . Not on file  Social History Narrative  . Not on file    Outpatient Medications Prior to Visit  Medication Sig Dispense Refill  . promethazine-codeine (PHENERGAN WITH CODEINE) 6.25-10 MG/5ML syrup Take 5 mLs by mouth every 6 (six) hours as needed for cough. 120 mL 0  . cetirizine (ZYRTEC) 10 MG tablet Take 1 tablet (10 mg total) by mouth daily. (Patient not taking: Reported on 03/03/2018) 30 tablet  11  . ipratropium (ATROVENT) 0.03 % nasal spray Place 2 sprays into both nostrils 2 (two) times daily. (Patient not taking: Reported on 03/03/2018) 30 mL 0  . losartan (COZAAR) 50 MG tablet Take 1 tablet (50 mg total) by mouth daily. (Patient not taking: Reported on 03/03/2018) 90 tablet 1  . venlafaxine (EFFEXOR) 75 MG tablet Take 1 tablet (75 mg total) by mouth 2 (two) times daily with a meal. (Patient not taking: Reported on 03/03/2018) 180 tablet 2  . HYDROcodone-acetaminophen (NORCO) 5-325 MG tablet Take 1 tablet by mouth every 6 (six) hours as needed for moderate pain. (Patient not taking: Reported on 03/03/2018) 40 tablet 0  . valACYclovir (VALTREX) 1000 MG tablet Take 1 tablet (1,000 mg total) by mouth 3 (three) times daily. (Patient not taking:  Reported on 03/03/2018) 21 tablet 0   No facility-administered medications prior to visit.     No Known Allergies  ROS Review of Systems  Constitutional: Negative.   HENT: Positive for congestion (nasal ).   Eyes: Negative.   Respiratory: Negative.   Cardiovascular: Negative.   Gastrointestinal: Negative.   Endocrine: Negative.   Genitourinary: Negative.   Musculoskeletal: Negative.   Skin: Negative.   Allergic/Immunologic: Negative.   Neurological: Positive for dizziness and headaches.  Hematological: Negative.   Psychiatric/Behavioral: Negative.    Objective:    Physical Exam  Constitutional: She is oriented to person, place, and time. She appears well-developed and well-nourished.  Eyes: Conjunctivae are normal.  Neck: Normal range of motion. Neck supple.  Cardiovascular: Normal rate, regular rhythm, normal heart sounds and intact distal pulses.  Pulmonary/Chest: Effort normal and breath sounds normal.  Abdominal: Soft. Bowel sounds are normal.  Musculoskeletal: Normal range of motion.  Neurological: She is alert and oriented to person, place, and time. She has normal reflexes.  Skin: Skin is warm and dry.  Psychiatric: She has a normal mood and affect. Her behavior is normal. Judgment and thought content normal.  Nursing note and vitals reviewed.  BP 124/88   Pulse 76   Temp 98 F (36.7 C) (Oral)   Ht 5' (1.524 m)   Wt 152 lb (68.9 kg)   SpO2 98%   BMI 29.69 kg/m  Wt Readings from Last 3 Encounters:  03/03/18 152 lb (68.9 kg)  10/05/17 160 lb (72.6 kg)  10/13/16 152 lb 9.6 oz (69.2 kg)     Health Maintenance Due  Topic Date Due  . COLONOSCOPY  06/27/2011    There are no preventive care reminders to display for this patient.  Lab Results  Component Value Date   TSH 2.330 03/03/2018   Lab Results  Component Value Date   WBC 6.8 03/03/2018   HGB 15.0 03/03/2018   HCT 43.9 03/03/2018   MCV 89 03/03/2018   PLT 251 03/03/2018   Lab Results    Component Value Date   NA 147 (H) 03/03/2018   K 5.5 (H) 03/03/2018   CO2 25 03/03/2018   GLUCOSE 98 03/03/2018   BUN 19 03/03/2018   CREATININE 0.83 03/03/2018   BILITOT 0.2 03/03/2018   ALKPHOS 92 03/03/2018   AST 17 03/03/2018   ALT 19 03/03/2018   PROT 7.9 03/03/2018   ALBUMIN 4.9 03/03/2018   CALCIUM 10.3 (H) 03/03/2018   Lab Results  Component Value Date   CHOL 211 (H) 03/03/2018   Lab Results  Component Value Date   HDL 100 03/03/2018   Lab Results  Component Value Date   LDLCALC 93 03/03/2018  Lab Results  Component Value Date   TRIG 88 03/03/2018   Lab Results  Component Value Date   CHOLHDL 2.1 03/03/2018   Lab Results  Component Value Date   HGBA1C 5.1 03/03/2018     Assessment & Plan:   1. Essential hypertension Blood pressure is stable at 124/88 today. Continue Losartan as prescribed. She will continue to decrease high sodium intake, excessive alcohol intake, increase potassium intake, smoking cessation, and increase physical activity of at least 30 minutes of cardio activity daily. She will continue to follow Heart Healthy or DASH diet.  2. Bilateral impacted cerumen Earwax removed successfully with no complaints or concerns. Patient tolerated procedure well. Inner ear canal mild redness noted post procedure. Patient to place mineral oil onto cotton ball and insert into his ear canal weekly, to possibly prevent future incidents of earwax buildup.  - Ear Lavage  3. Dental caries - Ambulatory referral to Dentistry  4. Healthcare maintenance - CBC with Differential - Comprehensive metabolic panel - TSH - Lipid Panel - Vitamin D, 25-hydroxy - Vitamin B12  5. Screening for diabetes mellitus Hgb A1c is normal at 5.1 today.  She will continue to decrease foods/beverages high in sugars and carbs and follow Heart Healthy or DASH diet. Increase physical activity to at least 30 minutes cardio exercise daily.  - POCT glycosylated hemoglobin (Hb  A1C) - POCT urinalysis dipstick  6. Need for Tdap vaccination - Tdap vaccine greater than or equal to 7yo IM  7. Follow up She will follow up in 1 month. No orders of the defined types were placed in this encounter.  Orders Placed This Encounter  Procedures  . Tdap vaccine greater than or equal to 7yo IM  . CBC with Differential  . Comprehensive metabolic panel  . TSH  . Lipid Panel  . Vitamin D, 25-hydroxy  . Vitamin B12  . Ambulatory referral to Dentistry  . POCT glycosylated hemoglobin (Hb A1C)  . POCT urinalysis dipstick  . Ear Lavage     Referral Orders     Ambulatory referral to Dentistry   Raliegh Ip,  MSN, FNP-C [facility] [address] [place], [place] [address] [phone]   Problem List Items Addressed This Visit      Cardiovascular and Mediastinum   HTN (hypertension) - Primary    Other Visit Diagnoses    Bilateral impacted cerumen       Relevant Orders   Ear Lavage   Dental caries       Relevant Orders   Ambulatory referral to Dentistry   Healthcare maintenance       Relevant Orders   CBC with Differential (Completed)   Comprehensive metabolic panel (Completed)   TSH (Completed)   Lipid Panel (Completed)   Vitamin D, 25-hydroxy (Completed)   Vitamin B12 (Completed)   Screening for diabetes mellitus       Relevant Orders   POCT glycosylated hemoglobin (Hb A1C) (Completed)   POCT urinalysis dipstick (Completed)   Need for Tdap vaccination       Relevant Orders   Tdap vaccine greater than or equal to 7yo IM (Completed)   Follow up          No orders of the defined types were placed in this encounter.   Follow-up: Return in about 1 month (around 04/01/2018).    Kallie Locks, FNP

## 2018-03-03 NOTE — Telephone Encounter (Signed)
-----   Message from Kallie Locks, FNP sent at 03/03/2018  9:37 AM EST ----- Regarding: "Dental Referral" Please inform patient that we will send her Dental referral today. She has medical insurance and should be covered.

## 2018-03-04 LAB — CBC WITH DIFFERENTIAL/PLATELET
Basophils Absolute: 0 10*3/uL (ref 0.0–0.2)
Basos: 0 %
EOS (ABSOLUTE): 0.1 10*3/uL (ref 0.0–0.4)
Eos: 2 %
Hematocrit: 43.9 % (ref 34.0–46.6)
Hemoglobin: 15 g/dL (ref 11.1–15.9)
Immature Grans (Abs): 0 10*3/uL (ref 0.0–0.1)
Immature Granulocytes: 0 %
Lymphocytes Absolute: 2.5 10*3/uL (ref 0.7–3.1)
Lymphs: 36 %
MCH: 30.5 pg (ref 26.6–33.0)
MCHC: 34.2 g/dL (ref 31.5–35.7)
MCV: 89 fL (ref 79–97)
Monocytes Absolute: 0.4 10*3/uL (ref 0.1–0.9)
Monocytes: 7 %
Neutrophils Absolute: 3.7 10*3/uL (ref 1.4–7.0)
Neutrophils: 55 %
Platelets: 251 10*3/uL (ref 150–450)
RBC: 4.91 x10E6/uL (ref 3.77–5.28)
RDW: 15.1 % (ref 11.7–15.4)
WBC: 6.8 10*3/uL (ref 3.4–10.8)

## 2018-03-04 LAB — LIPID PANEL
Chol/HDL Ratio: 2.1 ratio (ref 0.0–4.4)
Cholesterol, Total: 211 mg/dL — ABNORMAL HIGH (ref 100–199)
HDL: 100 mg/dL (ref >39)
LDL Calculated: 93 mg/dL (ref 0–99)
Triglycerides: 88 mg/dL (ref 0–149)
VLDL Cholesterol Cal: 18 mg/dL (ref 5–40)

## 2018-03-04 LAB — COMPREHENSIVE METABOLIC PANEL
ALT: 19 IU/L (ref 0–32)
AST: 17 IU/L (ref 0–40)
Albumin/Globulin Ratio: 1.6 (ref 1.2–2.2)
Albumin: 4.9 g/dL (ref 3.8–4.9)
Alkaline Phosphatase: 92 IU/L (ref 39–117)
BUN/Creatinine Ratio: 23 (ref 9–23)
BUN: 19 mg/dL (ref 6–24)
Bilirubin Total: 0.2 mg/dL (ref 0.0–1.2)
CO2: 25 mmol/L (ref 20–29)
Calcium: 10.3 mg/dL — ABNORMAL HIGH (ref 8.7–10.2)
Chloride: 107 mmol/L — ABNORMAL HIGH (ref 96–106)
Creatinine, Ser: 0.83 mg/dL (ref 0.57–1.00)
GFR calc Af Amer: 91 mL/min/{1.73_m2} (ref >59)
GFR calc non Af Amer: 79 mL/min/{1.73_m2} (ref >59)
Globulin, Total: 3 g/dL (ref 1.5–4.5)
Glucose: 98 mg/dL (ref 65–99)
Potassium: 5.5 mmol/L — ABNORMAL HIGH (ref 3.5–5.2)
Sodium: 147 mmol/L — ABNORMAL HIGH (ref 134–144)
Total Protein: 7.9 g/dL (ref 6.0–8.5)

## 2018-03-04 LAB — VITAMIN B12: Vitamin B-12: 1169 pg/mL (ref 232–1245)

## 2018-03-04 LAB — VITAMIN D 25 HYDROXY (VIT D DEFICIENCY, FRACTURES): Vit D, 25-Hydroxy: 9.3 ng/mL — ABNORMAL LOW (ref 30.0–100.0)

## 2018-03-04 LAB — TSH: TSH: 2.33 u[IU]/mL (ref 0.450–4.500)

## 2018-03-06 ENCOUNTER — Encounter: Payer: Self-pay | Admitting: Family Medicine

## 2018-03-06 ENCOUNTER — Other Ambulatory Visit: Payer: Self-pay | Admitting: Family Medicine

## 2018-03-06 DIAGNOSIS — E559 Vitamin D deficiency, unspecified: Secondary | ICD-10-CM

## 2018-03-06 MED ORDER — VITAMIN D (ERGOCALCIFEROL) 1.25 MG (50000 UNIT) PO CAPS: 50000.0000 [IU] | ORAL_CAPSULE | ORAL | 3 refills | Status: DC

## 2018-03-07 ENCOUNTER — Encounter: Payer: Self-pay | Admitting: Family Medicine

## 2018-03-07 DIAGNOSIS — K029 Dental caries, unspecified: Secondary | ICD-10-CM | POA: Insufficient documentation

## 2018-04-03 ENCOUNTER — Ambulatory Visit: Payer: Self-pay | Admitting: Family Medicine

## 2018-11-20 ENCOUNTER — Encounter (HOSPITAL_BASED_OUTPATIENT_CLINIC_OR_DEPARTMENT_OTHER): Payer: Self-pay | Admitting: Emergency Medicine

## 2018-11-20 ENCOUNTER — Emergency Department (HOSPITAL_BASED_OUTPATIENT_CLINIC_OR_DEPARTMENT_OTHER)
Admission: EM | Admit: 2018-11-20 | Discharge: 2018-11-20 | Disposition: A | Discharge status: Home / Self Care | Payer: Worker's Compensation | Attending: Emergency Medicine | Admitting: Emergency Medicine

## 2018-11-20 ENCOUNTER — Emergency Department (HOSPITAL_BASED_OUTPATIENT_CLINIC_OR_DEPARTMENT_OTHER): Payer: Worker's Compensation

## 2018-11-20 ENCOUNTER — Other Ambulatory Visit: Payer: Self-pay

## 2018-11-20 DIAGNOSIS — M25512 Pain in left shoulder: Secondary | ICD-10-CM | POA: Diagnosis not present

## 2018-11-20 DIAGNOSIS — E785 Hyperlipidemia, unspecified: Secondary | ICD-10-CM | POA: Diagnosis not present

## 2018-11-20 DIAGNOSIS — M25552 Pain in left hip: Secondary | ICD-10-CM | POA: Insufficient documentation

## 2018-11-20 DIAGNOSIS — W19XXXA Unspecified fall, initial encounter: Secondary | ICD-10-CM

## 2018-11-20 DIAGNOSIS — I10 Essential (primary) hypertension: Secondary | ICD-10-CM | POA: Diagnosis not present

## 2018-11-20 DIAGNOSIS — G40909 Epilepsy, unspecified, not intractable, without status epilepticus: Secondary | ICD-10-CM | POA: Insufficient documentation

## 2018-11-20 MED ORDER — METHOCARBAMOL 500 MG PO TABS: 500.0000 mg | ORAL_TABLET | Freq: Three times a day (TID) | ORAL | 0 refills | Status: DC | PRN

## 2018-11-20 MED ORDER — DICLOFENAC SODIUM 1 % TD GEL: 2.0000 g | Freq: Four times a day (QID) | TRANSDERMAL | 0 refills | Status: DC | PRN

## 2018-11-20 NOTE — Discharge Instructions (Signed)
You were seen in the emergency department today after a fall while at work.  I suspect that your joint pain and muscle tightness are related to the fall.  Please use the topical pain medication prescribed along with Tylenol and/or Motrin as needed.  The Robaxin is provided for muscle relaxation.  This can cause drowsiness.  You cannot drive a car while taking this medication.  Please only use as needed for severe symptoms.  Follow with your primary care doctor should your symptoms continue or be prolonged.

## 2018-11-20 NOTE — ED Provider Notes (Signed)
Emergency Department Provider Note   I have reviewed the triage vital signs and the nursing notes.   HISTORY  Chief Complaint Fall [facility])   HPI Denissa Cozart is a 58 y.o. female with past medical history reviewed below presents to the emergency department for evaluation of left shoulder and hip pain after a fall at work yesterday.  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  Patient states that she landed primarily on her left shoulder and left hip.  She has had stiffness and soreness worsening over the past 24 hours.  She denies numbness or tingling.  No head injury or neck pain.  No loss of consciousness.  No syncope.  Denies chest pain, abdominal pain, shortness of breath.  Stiffness is worse with movement or standing but patient is able to walk.   Past Medical History:  Diagnosis Date  . Hyperkalemia   . Hyperlipidemia   . Hypertension   . Seizures ([place])   . Vitamin D deficiency     Patient Active Problem List   Diagnosis Date Noted  . Dental caries 03/07/2018  . HTN (hypertension) 07/12/2016  . Hot flashes 07/12/2016  . Concussion with loss of consciousness 05/14/2015    History reviewed. No pertinent surgical history.  Allergies Patient has no known allergies.  Family History  Family history unknown: Yes    Social History Social History   Tobacco Use  . Smoking status: Never Smoker  . Smokeless tobacco: Never Used  Substance Use Topics  . Alcohol use: No    Alcohol/week: 0.0 standard drinks  . Drug use: No    Review of Systems  Constitutional: No fever/chills Gastrointestinal: No abdominal pain.   Genitourinary: Negative for dysuria. Musculoskeletal: Positive left shoulder and hip pain.  Skin: Negative for rash. Neurological: Negative for headaches, focal weakness or numbness.  10-point ROS otherwise negative.  ____________________________________________   PHYSICAL EXAM:  VITAL SIGNS: ED Triage Vitals  Enc Vitals Group     BP 11/20/18 0958 (!) 148/99     Pulse Rate 11/20/18 0958 64     Resp 11/20/18 0958 16     Temp 11/20/18 0958 98.7 F (37.1 C)     Temp Source 11/20/18 0958 Oral     SpO2 11/20/18 0958 100 %     Weight 11/20/18 0958 152 lb (68.9 kg)     Height 11/20/18 0958 5\' 5"  (1.651 m)   Constitutional: Alert and oriented. Well appearing and in no acute distress. Eyes: Conjunctivae are normal.  Head: Atraumatic. Nose: No congestion/rhinnorhea. Mouth/Throat: Mucous membranes are moist.  Neck: No stridor. No cervical spine tenderness to palpation. Cardiovascular: Normal rate, regular rhythm.  Respiratory: Normal respiratory effort.  Gastrointestinal: No distention.  Musculoskeletal: Normal passive range of motion of the left shoulder, elbow, wrist.  No bruising or deformity.  Patient with some pain with range of motion of the left hip.  Normal range of motion of the left knee and ankle. Neurologic:  Normal speech and language.  Skin:  Skin is warm, dry and intact. No rash noted.  ____________________________________________  RADIOLOGY  Dg Shoulder Left  Result Date: 11/20/2018 CLINICAL DATA:  Fall, pain EXAM: LEFT SHOULDER - 2+ VIEW COMPARISON:  None. FINDINGS: No fracture or dislocation of the left shoulder. Mild glenohumeral joint arthrosis. The acromioclavicular joint is preserved. Partially imaged left chest. IMPRESSION: No fracture or dislocation of the left shoulder. Mild glenohumeral joint arthrosis. The acromioclavicular joint  is preserved. Electronically Signed   By: Lauralyn Primes M.D.   On: 11/20/2018 10:37   Dg Hip Unilat W Or Wo Pelvis 2-3 Views Left  Result Date: 11/20/2018 CLINICAL DATA:  Fall, pain EXAM: DG HIP (WITH OR WITHOUT PELVIS) 2-3V LEFT COMPARISON:  None. FINDINGS: No fracture or dislocation of the left hip. Joint spaces are preserved. The pelvis and proximal right femur are unremarkable, seen in frontal view only.  Nonobstructive pattern of overlying bowel gas. IMPRESSION: No fracture or dislocation of the left hip. Joint spaces are preserved. Electronically Signed   By: Lauralyn Primes M.D.   On: 11/20/2018 10:38    ____________________________________________   PROCEDURES  Procedure(s) performed:   Procedures  None  ____________________________________________   INITIAL IMPRESSION / ASSESSMENT AND PLAN / ED COURSE  Pertinent labs & imaging results that were available during my care of the patient were reviewed by me and considered in my medical decision making (see chart for details).   Patient presents to the emergency department for evaluation after a fall while at work yesterday.  Plain films interpreted and read with no acute fractures or dislocations.  Plan for topical pain medication along with Robaxin as needed for severe symptoms.  Cautioned not to drive while taking Robaxin and that this may cause drowsiness.    ____________________________________________  FINAL CLINICAL IMPRESSION(S) / ED DIAGNOSES  Final diagnoses:  Fall, initial encounter  Acute pain of left shoulder  Left hip pain    NEW OUTPATIENT MEDICATIONS STARTED DURING THIS VISIT:  New Prescriptions   DICLOFENAC SODIUM (VOLTAREN) 1 % GEL    Apply 2 g topically 4 (four) times daily as needed.   METHOCARBAMOL (ROBAXIN) 500 MG TABLET    Take 1 tablet (500 mg total) by mouth every 8 (eight) hours as needed for muscle spasms.    Note:  This document was prepared using Dragon voice recognition software and may include unintentional dictation errors.  Alona Bene, MD, [facility] Emergency Medicine    Rhodie Cienfuegos, Arlyss Repress, MD 11/20/18 [phone]

## 2018-11-20 NOTE — ED Triage Notes (Signed)
Pt reports fall yesterday at work , tripped landed on left side, no head injury, no loc, co left shoulder pain and left hip pain.

## 2019-03-24 ENCOUNTER — Ambulatory Visit: Payer: Self-pay | Attending: Internal Medicine

## 2019-03-24 DIAGNOSIS — Z23 Encounter for immunization: Secondary | ICD-10-CM | POA: Insufficient documentation

## 2019-03-24 NOTE — Progress Notes (Signed)
   Covid-19 Vaccination Clinic  Name:  Adamae Ricklefs    MRN: [id number] DOB: 12-30-1960  03/24/2019  Ms. Hilaire was observed post Covid-19 immunization for 15 minutes without incident. She was provided with Vaccine Information Sheet and instruction to access the V-Safe system.   Ms. Goltz was instructed to call 911 with any severe reactions post vaccine: Marland Kitchen Difficulty breathing  . Swelling of face and throat  . A fast heartbeat  . A bad rash all over body  . Dizziness and weakness   Immunizations Administered    Name Date Dose VIS Date Route   Pfizer COVID-19 Vaccine 03/24/2019  3:55 PM 0.3 mL 12/29/2018 Intramuscular   Manufacturer: [employer], [employer]   Lot: [id number]   NDC: [address]-0

## 2019-04-14 ENCOUNTER — Ambulatory Visit: Payer: Self-pay | Attending: Internal Medicine

## 2019-04-14 DIAGNOSIS — Z23 Encounter for immunization: Secondary | ICD-10-CM

## 2019-04-14 NOTE — Progress Notes (Signed)
   Covid-19 Vaccination Clinic  Name:  Shari Gonzales    MRN: [id number] DOB: 07-23-1960  04/14/2019  [name] was observed post Covid-19 immunization for 15 minutes without incident. She was provided with Vaccine Information Sheet and instruction to access the V-Safe system.   [name] was instructed to call 911 with any severe reactions post vaccine: Marland Kitchen Difficulty breathing  . Swelling of face and throat  . A fast heartbeat  . A bad rash all over body  . Dizziness and weakness   Immunizations Administered    Name Date Dose VIS Date Route   Pfizer COVID-19 Vaccine 04/14/2019  2:11 PM 0.3 mL 12/29/2018 Intramuscular   Manufacturer: [employer], [employer]   Lot: [id number]   NDC: [address]-3

## 2019-10-04 ENCOUNTER — Other Ambulatory Visit: Payer: Self-pay

## 2019-10-04 ENCOUNTER — Encounter (HOSPITAL_BASED_OUTPATIENT_CLINIC_OR_DEPARTMENT_OTHER): Payer: Self-pay | Admitting: *Deleted

## 2019-10-04 ENCOUNTER — Emergency Department (HOSPITAL_BASED_OUTPATIENT_CLINIC_OR_DEPARTMENT_OTHER)
Admission: EM | Admit: 2019-10-04 | Discharge: 2019-10-04 | Disposition: A | Discharge status: Home / Self Care | Payer: BLUE CROSS/BLUE SHIELD | Attending: Emergency Medicine | Admitting: Emergency Medicine

## 2019-10-04 DIAGNOSIS — Z79899 Other long term (current) drug therapy: Secondary | ICD-10-CM | POA: Insufficient documentation

## 2019-10-04 DIAGNOSIS — R519 Headache, unspecified: Secondary | ICD-10-CM | POA: Diagnosis present

## 2019-10-04 DIAGNOSIS — Z20822 Contact with and (suspected) exposure to covid-19: Secondary | ICD-10-CM | POA: Insufficient documentation

## 2019-10-04 DIAGNOSIS — I10 Essential (primary) hypertension: Secondary | ICD-10-CM | POA: Diagnosis not present

## 2019-10-04 DIAGNOSIS — B349 Viral infection, unspecified: Secondary | ICD-10-CM | POA: Diagnosis not present

## 2019-10-04 LAB — COMPREHENSIVE METABOLIC PANEL
ALT: 21 U/L (ref 0–44)
AST: 22 U/L (ref 15–41)
Albumin: 4 g/dL (ref 3.5–5.0)
Alkaline Phosphatase: 81 U/L (ref 38–126)
Anion gap: 11 (ref 5–15)
BUN: 13 mg/dL (ref 6–20)
CO2: 23 mmol/L (ref 22–32)
Calcium: 8.8 mg/dL — ABNORMAL LOW (ref 8.9–10.3)
Chloride: 102 mmol/L (ref 98–111)
Creatinine, Ser: 0.71 mg/dL (ref 0.44–1.00)
GFR calc Af Amer: >60 mL/min (ref >60)
GFR calc non Af Amer: >60 mL/min (ref >60)
Glucose, Bld: 92 mg/dL (ref 70–99)
Potassium: 3.9 mmol/L (ref 3.5–5.1)
Sodium: 136 mmol/L (ref 135–145)
Total Bilirubin: 0.2 mg/dL — ABNORMAL LOW (ref 0.3–1.2)
Total Protein: 7.5 g/dL (ref 6.5–8.1)

## 2019-10-04 LAB — CBC WITH DIFFERENTIAL/PLATELET
Abs Immature Granulocytes: 0.02 10*3/uL (ref 0.00–0.07)
Basophils Absolute: 0 10*3/uL (ref 0.0–0.1)
Basophils Relative: 0 %
Eosinophils Absolute: 0.1 10*3/uL (ref 0.0–0.5)
Eosinophils Relative: 1 %
HCT: 38.1 % (ref 36.0–46.0)
Hemoglobin: 13.8 g/dL (ref 12.0–15.0)
Immature Granulocytes: 0 %
Lymphocytes Relative: 31 %
Lymphs Abs: 1.6 10*3/uL (ref 0.7–4.0)
MCH: 30.7 pg (ref 26.0–34.0)
MCHC: 36.2 g/dL — ABNORMAL HIGH (ref 30.0–36.0)
MCV: 84.9 fL (ref 80.0–100.0)
Monocytes Absolute: 0.4 10*3/uL (ref 0.1–1.0)
Monocytes Relative: 8 %
Neutro Abs: 3.1 10*3/uL (ref 1.7–7.7)
Neutrophils Relative %: 60 %
Platelets: 254 10*3/uL (ref 150–400)
RBC: 4.49 MIL/uL (ref 3.87–5.11)
RDW: 13.8 % (ref 11.5–15.5)
WBC: 5.3 10*3/uL (ref 4.0–10.5)
nRBC: 0 % (ref 0.0–0.2)

## 2019-10-04 LAB — SARS CORONAVIRUS 2 BY RT PCR (HOSPITAL ORDER, PERFORMED IN ~~LOC~~ HOSPITAL LAB): SARS Coronavirus 2: NEGATIVE

## 2019-10-04 MED ORDER — METOCLOPRAMIDE HCL 5 MG/ML IJ SOLN
10.0000 mg | Freq: Once | INTRAMUSCULAR | Status: AC
  Administered 2019-10-04: 10 mg via INTRAVENOUS
  Filled 2019-10-04: qty 2

## 2019-10-04 MED ORDER — DIPHENHYDRAMINE HCL 50 MG/ML IJ SOLN
25.0000 mg | Freq: Once | INTRAMUSCULAR | Status: AC
  Administered 2019-10-04: 25 mg via INTRAVENOUS
  Filled 2019-10-04: qty 1

## 2019-10-04 MED ORDER — SODIUM CHLORIDE 0.9 % IV BOLUS
1000.0000 mL | Freq: Once | INTRAVENOUS | Status: AC
  Administered 2019-10-04: 1000 mL via INTRAVENOUS

## 2019-10-04 NOTE — ED Provider Notes (Signed)
[facility] EMERGENCY DEPARTMENT Provider Note   CSN: [id number] Arrival date & time: 10/04/19  1153     History No chief complaint on file.   Shari Gonzales is a 59 y.o. female with a past medical history of hypertension, hyperlipidemia presenting to the ED with a chief complaint of headache, body aches, cough, congestion, vomiting since waking up this morning.  No known Covid exposures or sick contacts with similar symptoms.  She had one episode of nonbloody, nonbilious emesis at work today which prompted her visit to the ER.  States that she has pain to her bilateral feet which she believes is from walking around at work all day.  Denies any injuries or falls.  Denies any fevers, chest pain, shortness of breath, productive cough.  She has not tried taking any medications to help with her symptoms.  No hemoptysis or leg swelling.  HPI     Past Medical History:  Diagnosis Date  . Hyperkalemia   . Hyperlipidemia   . Hypertension   . Seizures (HCC)   . Vitamin D deficiency     Patient Active Problem List   Diagnosis Date Noted  . Dental caries 03/07/2018  . HTN (hypertension) 07/12/2016  . Hot flashes 07/12/2016  . Concussion with loss of consciousness 05/14/2015    History reviewed. No pertinent surgical history.   OB History   No obstetric history on file.     Family History  Family history unknown: Yes    Social History   Tobacco Use  . Smoking status: Never Smoker  . Smokeless tobacco: Never Used  Substance Use Topics  . Alcohol use: No    Alcohol/week: 0.0 standard drinks  . Drug use: No    Home Medications Prior to Admission medications   Medication Sig Start Date End Date Taking? Authorizing Provider  cetirizine (ZYRTEC) 10 MG tablet Take 1 tablet (10 mg total) by mouth daily. Patient not taking: Reported on 03/03/2018 10/13/16   Bing Neighbors, FNP  diclofenac sodium (VOLTAREN) 1 % GEL Apply 2 g topically 4 (four) times daily as  needed. 11/20/18   Long, Arlyss Repress, MD  ipratropium (ATROVENT) 0.03 % nasal spray Place 2 sprays into both nostrils 2 (two) times daily. Patient not taking: Reported on 03/03/2018 10/13/16   Bing Neighbors, FNP  losartan (COZAAR) 50 MG tablet Take 1 tablet (50 mg total) by mouth daily. Patient not taking: Reported on 03/03/2018 07/12/16   Bing Neighbors, FNP  methocarbamol (ROBAXIN) 500 MG tablet Take 1 tablet (500 mg total) by mouth every 8 (eight) hours as needed for muscle spasms. 11/20/18   Long, Arlyss Repress, MD  venlafaxine (EFFEXOR) 75 MG tablet Take 1 tablet (75 mg total) by mouth 2 (two) times daily with a meal. Patient not taking: Reported on 03/03/2018 07/12/16   Bing Neighbors, FNP  Vitamin D, Ergocalciferol, (DRISDOL) 1.25 MG (50000 UT) CAPS capsule Take 1 capsule (50,000 Units total) by mouth every 7 (seven) days. 03/06/18   Kallie Locks, FNP    Allergies    Patient has no known allergies.  Review of Systems   Review of Systems  Constitutional: Negative for appetite change, chills and fever.  HENT: Positive for congestion. Negative for ear pain, rhinorrhea, sneezing and sore throat.   Eyes: Negative for photophobia and visual disturbance.  Respiratory: Positive for cough. Negative for chest tightness, shortness of breath and wheezing.   Cardiovascular: Negative for chest pain and palpitations.  Gastrointestinal: Positive for  nausea and vomiting. Negative for abdominal pain, blood in stool, constipation and diarrhea.  Genitourinary: Negative for dysuria, hematuria and urgency.  Musculoskeletal: Positive for myalgias.  Skin: Negative for rash.  Neurological: Positive for headaches. Negative for dizziness, weakness and light-headedness.    Physical Exam Updated Vital Signs BP (!) 145/99 (BP Location: Left Arm)   Pulse 75   Temp 98.1 F (36.7 C) (Oral)   Resp 18   Ht 5\' 5"  (1.651 m)   Wt 68 kg   SpO2 99%   BMI 24.95 kg/m   Physical Exam Vitals and nursing note  reviewed.  Constitutional:      General: She is not in acute distress.    Appearance: She is well-developed.  HENT:     Head: Normocephalic and atraumatic.     Nose: Nose normal.  Eyes:     General: No scleral icterus.       Right eye: No discharge.        Left eye: No discharge.     Conjunctiva/sclera: Conjunctivae normal.     Pupils: Pupils are equal, round, and reactive to light.  Cardiovascular:     Rate and Rhythm: Normal rate and regular rhythm.     Heart sounds: Normal heart sounds. No murmur heard.  No friction rub. No gallop.   Pulmonary:     Effort: Pulmonary effort is normal. No respiratory distress.     Breath sounds: Normal breath sounds.  Abdominal:     General: Bowel sounds are normal. There is no distension.     Palpations: Abdomen is soft.     Tenderness: There is no abdominal tenderness. There is no guarding.  Musculoskeletal:        General: Normal range of motion.     Cervical back: Normal range of motion and neck supple.  Skin:    General: Skin is warm and dry.     Findings: No rash.  Neurological:     Mental Status: She is alert.     Sensory: No sensory deficit.     Motor: No abnormal muscle tone.     Coordination: Coordination normal.     ED Results / Procedures / Treatments   Labs (all labs ordered are listed, but only abnormal results are displayed) Labs Reviewed  COMPREHENSIVE METABOLIC PANEL - Abnormal; Notable for the following components:      Result Value   Calcium 8.8 (*)    Total Bilirubin 0.2 (*)    All other components within normal limits  CBC WITH DIFFERENTIAL/PLATELET - Abnormal; Notable for the following components:   MCHC 36.2 (*)    All other components within normal limits  SARS CORONAVIRUS 2 BY RT PCR (HOSPITAL ORDER, PERFORMED IN [facility])    EKG None  Radiology No results found.  Procedures Procedures (including critical care time)  Medications Ordered in ED Medications  sodium chloride 0.9 %  bolus 1,000 mL (1,000 mLs Intravenous New Bag/Given 10/04/19 1244)  metoCLOPramide (REGLAN) injection 10 mg (10 mg Intravenous Given 10/04/19 1246)  diphenhydrAMINE (BENADRYL) injection 25 mg (25 mg Intravenous Given 10/04/19 1244)    ED Course  I have reviewed the triage vital signs and the nursing notes.  Pertinent labs & imaging results that were available during my care of the patient were reviewed by me and considered in my medical decision making (see chart for details).    MDM Rules/Calculators/A&P  Chyan Carnero was evaluated in Emergency Department on 10/04/19 for the symptoms described in the history of present illness. He/she was evaluated in the context of the global COVID-19 pandemic, which necessitated consideration that the patient might be at risk for infection with the SARS-CoV-2 virus that causes COVID-19. Institutional protocols and algorithms that pertain to the evaluation of patients at risk for COVID-19 are in a state of rapid change based on information released by regulatory bodies including the CDC and federal and state organizations. These policies and algorithms were followed during the patient's care in the ED.  59 year old female with a past medical history of hypertension, hyperlipidemia presenting to the ED with a chief complaint of headache, body aches, cough, congestion and vomiting since waking up this morning.  One episode of emesis at work today which prompted her visit to the ER.  No fevers, chest pain, productive cough or shortness of breath.  On exam patient is overall well-appearing.  Abdomen is soft, nontender nondistended.  Lungs are clear to auscultation bilaterally.  Oxygen saturations are 99% on room air, she is not tachypneic or tachycardic.   Covid test is negative.  CBC, CMP unremarkable.  Patient was given IV fluids, migraine cocktail here with an Supramid in her symptoms.  Suspect that her symptoms are viral in nature.  She is  comfortable with discharge home with supportive management and following up with PCP.   Patient is hemodynamically stable, in NAD, and able to ambulate in the ED. Evaluation does not show pathology that would require ongoing emergent intervention or inpatient treatment. I explained the diagnosis to the patient. Pain has been managed and has no complaints prior to discharge. Patient is comfortable with above plan and is stable for discharge at this time. All questions were answered prior to disposition. Strict return precautions for returning to the ED were discussed. Encouraged follow up with PCP.   An After Visit Summary was printed and given to the patient.   Portions of this note were generated with Scientist, clinical (histocompatibility and immunogenetics). Dictation errors may occur despite best attempts at proofreading.  Final Clinical Impression(s) / ED Diagnoses Final diagnoses:  Viral illness    Rx / DC Orders ED Discharge Orders    None       Dietrich Pates, PA-C 10/04/19 1349    Tilden Fossa, MD 10/05/19 [phone]

## 2019-10-04 NOTE — Discharge Instructions (Addendum)
Continue Tylenol or ibuprofen as needed to help with your symptoms. Follow-up with your primary care provider. Return to the ER if you start to experience worsening symptoms, chest pain, shortness of breath, numbness in arms or legs, injuries or falls.

## 2019-10-04 NOTE — ED Triage Notes (Signed)
Headache, body aches, sore throat, runny nose, vomiting x 1 at work this am. Abdominal upset.

## 2021-01-03 ENCOUNTER — Encounter (HOSPITAL_BASED_OUTPATIENT_CLINIC_OR_DEPARTMENT_OTHER): Payer: Self-pay | Admitting: Emergency Medicine

## 2021-01-03 ENCOUNTER — Other Ambulatory Visit: Payer: Self-pay

## 2021-01-03 ENCOUNTER — Emergency Department (HOSPITAL_BASED_OUTPATIENT_CLINIC_OR_DEPARTMENT_OTHER)
Admission: EM | Admit: 2021-01-03 | Discharge: 2021-01-03 | Disposition: A | Discharge status: Home / Self Care | Payer: BLUE CROSS/BLUE SHIELD | Attending: Emergency Medicine | Admitting: Emergency Medicine

## 2021-01-03 ENCOUNTER — Emergency Department (HOSPITAL_BASED_OUTPATIENT_CLINIC_OR_DEPARTMENT_OTHER): Payer: BLUE CROSS/BLUE SHIELD

## 2021-01-03 DIAGNOSIS — M25511 Pain in right shoulder: Secondary | ICD-10-CM | POA: Diagnosis not present

## 2021-01-03 DIAGNOSIS — M7989 Other specified soft tissue disorders: Secondary | ICD-10-CM | POA: Insufficient documentation

## 2021-01-03 DIAGNOSIS — I10 Essential (primary) hypertension: Secondary | ICD-10-CM | POA: Insufficient documentation

## 2021-01-03 DIAGNOSIS — M79601 Pain in right arm: Secondary | ICD-10-CM | POA: Insufficient documentation

## 2021-01-03 DIAGNOSIS — M25521 Pain in right elbow: Secondary | ICD-10-CM | POA: Diagnosis not present

## 2021-01-03 MED ORDER — HYDROCODONE-ACETAMINOPHEN 5-325 MG PO TABS
1.0000 | ORAL_TABLET | Freq: Once | ORAL | Status: AC
  Administered 2021-01-03: 1 via ORAL
  Filled 2021-01-03: qty 1

## 2021-01-03 MED ORDER — HYDROCODONE-ACETAMINOPHEN 5-325 MG PO TABS: 1.0000 | ORAL_TABLET | Freq: Four times a day (QID) | ORAL | 0 refills | Status: DC | PRN

## 2021-01-03 MED ORDER — MELOXICAM 7.5 MG PO TABS: 7.5000 mg | ORAL_TABLET | Freq: Every day | ORAL | 0 refills | Status: DC

## 2021-01-03 NOTE — Discharge Instructions (Signed)
Please read and follow all provided instructions.  Your diagnoses today include:  1. Right arm pain    Tests performed today include: Right upper extremity ultrasound: no evidence of blood clot or other problems Vital signs. See below for your results today.   Medications prescribed:  Vicodin (hydrocodone/acetaminophen) - narcotic pain medication  DO NOT drive or perform any activities that require you to be awake and alert because this medicine can make you drowsy. BE VERY CAREFUL not to take multiple medicines containing Tylenol (also called acetaminophen). Doing so can lead to an overdose which can damage your liver and cause liver failure and possibly death.  Meloxicam - anti-inflammatory pain medication  You have been prescribed an anti-inflammatory medication or NSAID. Take with food. Do not take aspirin, ibuprofen, or naproxen if taking this medication. Take smallest effective dose for the shortest duration needed for your pain. Stop taking if you experience stomach pain or vomiting.   Take any prescribed medications only as directed.  Home care instructions:  Follow any educational materials contained in this packet.  BE VERY CAREFUL not to take multiple medicines containing Tylenol (also called acetaminophen). Doing so can lead to an overdose which can damage your liver and cause liver failure and possibly death.   Follow-up instructions: Please follow-up with your primary care provider in the next 3 days for further evaluation of your symptoms.   Return instructions:  Please return to the Emergency Department if you experience worsening symptoms.  Return with fever, worsening pain or swelling, redness or warmth of the arm, numbness or tingling Please return if you have any other emergent concerns.  Additional Information:  Your vital signs today were: BP (!) 134/96 (BP Location: Right Arm)    Pulse 66    Temp 98.4 F (36.9 C) (Oral)    Resp 16    Ht 5\' 4"  (1.626 m)     Wt 81.6 kg    SpO2 99%    BMI 30.90 kg/m  If your blood pressure (BP) was elevated above 135/85 this visit, please have this repeated by your doctor within one month. --------------

## 2021-01-03 NOTE — ED Provider Notes (Signed)
[facility] EMERGENCY DEPARTMENT Provider Note   CSN: [id number] Arrival date & time: 01/03/21  1207     History Chief Complaint  Patient presents with   Arm Pain    Shari Gonzales is a 60 y.o. female.  Patient presents the emergency department for evaluation of right upper extremity pain and swelling.  Symptoms started about 3 days ago.  No injuries.  Patient reports pain that is worse in the elbow, upper arm, and shoulder area.  Worse with palpation and movement.  No redness or warmth.  No numbness or tingling in the fingers or hand.  She feels some focal swelling in the elbow area.  No lumps or bumps on the chest noted.  Patient thinks that she has had some minor symptoms to the left arm now, but this is much less on the right side.  She has never had symptoms similar to this before. The onset of this condition was acute. The course is constant. Aggravating factors: none. Alleviating factors: none.  She has tried ibuprofen without relief.       Past Medical History:  Diagnosis Date   Hyperkalemia    Hyperlipidemia    Hypertension    Seizures (HCC)    Vitamin D deficiency     Patient Active Problem List   Diagnosis Date Noted   Dental caries 03/07/2018   HTN (hypertension) 07/12/2016   Hot flashes 07/12/2016   Concussion with loss of consciousness 05/14/2015    History reviewed. No pertinent surgical history.   OB History   No obstetric history on file.     Family History  Family history unknown: Yes    Social History   Tobacco Use   Smoking status: Never   Smokeless tobacco: Never  Vaping Use   Vaping Use: Never used  Substance Use Topics   Alcohol use: No    Alcohol/week: 0.0 standard drinks   Drug use: No    Home Medications Prior to Admission medications   Medication Sig Start Date End Date Taking? Authorizing Provider  cetirizine (ZYRTEC) 10 MG tablet Take 1 tablet (10 mg total) by mouth daily. Patient not taking: Reported on  03/03/2018 10/13/16   Bing Neighbors, FNP  diclofenac sodium (VOLTAREN) 1 % GEL Apply 2 g topically 4 (four) times daily as needed. 11/20/18   Long, Arlyss Repress, MD  ipratropium (ATROVENT) 0.03 % nasal spray Place 2 sprays into both nostrils 2 (two) times daily. Patient not taking: Reported on 03/03/2018 10/13/16   Bing Neighbors, FNP  losartan (COZAAR) 50 MG tablet Take 1 tablet (50 mg total) by mouth daily. Patient not taking: Reported on 03/03/2018 07/12/16   Bing Neighbors, FNP  methocarbamol (ROBAXIN) 500 MG tablet Take 1 tablet (500 mg total) by mouth every 8 (eight) hours as needed for muscle spasms. 11/20/18   Long, Arlyss Repress, MD  venlafaxine (EFFEXOR) 75 MG tablet Take 1 tablet (75 mg total) by mouth 2 (two) times daily with a meal. Patient not taking: Reported on 03/03/2018 07/12/16   Bing Neighbors, FNP  Vitamin D, Ergocalciferol, (DRISDOL) 1.25 MG (50000 UT) CAPS capsule Take 1 capsule (50,000 Units total) by mouth every 7 (seven) days. 03/06/18   Kallie Locks, FNP    Allergies    Patient has no known allergies.  Review of Systems   Review of Systems  Constitutional:  Negative for activity change.  Musculoskeletal:  Positive for arthralgias, joint swelling and myalgias. Negative for back pain and neck  pain.  Skin:  Negative for wound.  Neurological:  Negative for weakness and numbness.   Physical Exam Updated Vital Signs BP (!) 156/97 (BP Location: Left Arm)    Pulse 70    Resp 16    Ht 5\' 4"  (1.626 m)    Wt 81.6 kg    SpO2 98%    BMI 30.90 kg/m   Physical Exam Vitals and nursing note reviewed.  Constitutional:      Appearance: She is well-developed.  HENT:     Head: Normocephalic and atraumatic.  Eyes:     Conjunctiva/sclera: Conjunctivae normal.  Pulmonary:     Effort: No respiratory distress.  Musculoskeletal:     Cervical back: Normal range of motion and neck supple.     Comments: Right upper extremity: Patient with tenderness from the elbow, especially  lateral elbow, upper arm to the shoulder.  Pain seems worse with flexion extension of the elbow and with abduction/abduction of the shoulder.  No redness or warmth.  2+ radial pulse.  Distal sensation intact with capillary refill less than 2 seconds.  No severe swelling or edema noted.  Left upper extremity: Distally CMS is intact.  No swelling noted.  No focal tenderness.  Skin:    General: Skin is warm and dry.  Neurological:     Mental Status: She is alert.    ED Results / Procedures / Treatments   Labs (all labs ordered are listed, but only abnormal results are displayed) Labs Reviewed - No data to display  EKG None  Radiology No results found.  Procedures Procedures   Medications Ordered in ED Medications - No data to display  ED Course  I have reviewed the triage vital signs and the nursing notes.  Pertinent labs & imaging results that were available during my care of the patient were reviewed by me and considered in my medical decision making (see chart for details).  Patient seen and examined. Plan discussed with patient.   Labs: None ordered  Imaging: Doppler study, right upper extremity  Medications/Fluids: P.o. Vicodin while awaiting  Vital signs reviewed and are as follows: BP (!) 156/97 (BP Location: Left Arm)    Pulse 70    Resp 16    Ht 5\' 4"  (1.626 m)    Wt 81.6 kg    SpO2 98%    BMI 30.90 kg/m   Initial impression: Right upper extremity pain, nonfocal, no signs of compartment syndrome.  3:06 PM On re-exam patient appears comfortable.  Pain controlled after treatment.  Discussed pertinent results with patient at bedside. This included negative upper extremity Doppler. They are comfortable with discharge at this time.   Home treatment: Prescription written for Vicodin, meloxicam.   Follow-up: Encouraged patient to follow-up with their provider if symptoms persist for 3 days. Encouraged return to ED with worsening swelling, uncontrolled pain, numbness  or tingling, fevers. Patient verbalized understanding and agreed with plan.      MDM Rules/Calculators/A&P                         Patient with generalized right upper extremity pain and swelling.  She has pain at the elbow and shoulder.  No injuries.  No redness or warmth to suggest cellulitis or abscess.  DVT study performed and is negative for blood clot.  Patient has normal radial pulse and no concern for arterial insufficiency.  We will treat symptomatically at this time and have her follow-up for recheck  if not improving.      Final Clinical Impression(s) / ED Diagnoses Final diagnoses:  Right arm pain    Rx / DC Orders ED Discharge Orders          Ordered    meloxicam (MOBIC) 7.5 MG tablet  Daily        01/03/21 1504    HYDROcodone-acetaminophen (NORCO/VICODIN) 5-325 MG tablet  Every 6 hours PRN        01/03/21 1504             Renne Crigler, PA-C 01/03/21 1507    Terrilee Files, MD 01/03/21 1745

## 2021-01-03 NOTE — ED Triage Notes (Signed)
Pt c/o pain and swelling to right arm for couple of days. Pt noticed swelling and some pain to left arm about a day or tow. Pt denies recent injury.

## 2021-01-21 ENCOUNTER — Other Ambulatory Visit: Payer: Self-pay

## 2021-01-21 ENCOUNTER — Encounter: Payer: Self-pay | Admitting: Nurse Practitioner

## 2021-01-21 ENCOUNTER — Ambulatory Visit (INDEPENDENT_AMBULATORY_CARE_PROVIDER_SITE_OTHER): Payer: BLUE CROSS/BLUE SHIELD | Admitting: Nurse Practitioner

## 2021-01-21 VITALS — BP 114/84 | HR 72 | Temp 98.0°F | Ht 60.0 in | Wt 159.0 lb

## 2021-01-21 DIAGNOSIS — M79601 Pain in right arm: Secondary | ICD-10-CM | POA: Diagnosis not present

## 2021-01-21 DIAGNOSIS — M25511 Pain in right shoulder: Secondary | ICD-10-CM

## 2021-01-21 MED ORDER — PREDNISONE 20 MG PO TABS: 20.0000 mg | ORAL_TABLET | Freq: Two times a day (BID) | ORAL | 0 refills | Status: AC

## 2021-01-21 MED ORDER — KETOROLAC TROMETHAMINE 30 MG/ML IJ SOLN
15.0000 mg | Freq: Once | INTRAMUSCULAR | Status: AC
  Administered 2021-01-21: 15 mg via INTRAMUSCULAR

## 2021-01-21 MED ORDER — MELOXICAM 15 MG PO TABS: 15.0000 mg | ORAL_TABLET | Freq: Every day | ORAL | 0 refills | Status: DC

## 2021-01-21 NOTE — Progress Notes (Signed)
[facility] Patient [facility] [address]astasia Pall [place], [place]  [address] Phone:  [phone]   Fax:  [phone] Subjective:   Patient ID: Shari Gonzales, female    DOB: 09/07/1960, 61 y.o.   MRN: [id number]  Chief Complaint  Patient presents with   Hospitalization Follow-up    Pt is here today for hospital follow up for right arm pain. Pt states that she is still having numbness and tingling sensations in her right arm radiating down to her hand. Pt is still having pain as well.    HPI Caedyn Tassinari 61 y.o. female  has a past medical history of Hyperkalemia, Hyperlipidemia, Hypertension, Seizures (HCC), and Vitamin D deficiency. To the [facility] for RUE pain and numbness.  Patient states that she has had pain and numbness in right arm x 1 mth, has had symptoms since injuring right shoulder at work. Was evaluated in the ED and discharged with medications, which offered only temporary relief in symptoms. States that she has completed prescribed Vicodin and Meloxicam, and pain has returned. Currently rates pain 10/10 and describes as shooting pain from right shoulder. Also describes pain as burning. Pain worsens with pulling, laying on effected extremity and raising right arm above head. Patient states that she currently works in [employer] and does a lot of grabbing and pulling of laundry , which worsens pain.  Patient denies any recent trauma or injury. Has not taken any additional medications for pain management since completing previous prescription from the ED.    Tenderness with palpation of scapula, limited ROM of RUE  Past Medical History:  Diagnosis Date   Hyperkalemia    Hyperlipidemia    Hypertension    Seizures (HCC)    Vitamin D deficiency     History reviewed. No pertinent surgical history.  Family History  Family history unknown: Yes    Social History   Socioeconomic History   Marital status: Single    Spouse name: Not on file   Number of children: Not on  file   Years of education: Not on file   Highest education level: Not on file  Occupational History   Not on file  Tobacco Use   Smoking status: Never   Smokeless tobacco: Never  Vaping Use   Vaping Use: Never used  Substance and Sexual Activity   Alcohol use: No    Alcohol/week: 0.0 standard drinks   Drug use: Not Currently   Sexual activity: Yes    Birth control/protection: Post-menopausal  Other Topics Concern   Not on file  Social History Narrative   Not on file   Social Determinants of Health   Financial Resource Strain: Not on file  Food Insecurity: Not on file  Transportation Needs: Not on file  Physical Activity: Not on file  Stress: Not on file  Social Connections: Not on file  Intimate Partner Violence: Not on file    Outpatient Medications Prior to Visit  Medication Sig Dispense Refill   diclofenac sodium (VOLTAREN) 1 % GEL Apply 2 g topically 4 (four) times daily as needed. 100 g 0   HYDROcodone-acetaminophen (NORCO/VICODIN) 5-325 MG tablet Take 1 tablet by mouth every 6 (six) hours as needed for severe pain. 8 tablet 0   methocarbamol (ROBAXIN) 500 MG tablet Take 1 tablet (500 mg total) by mouth every 8 (eight) hours as needed for muscle spasms. 20 tablet 0   Vitamin D, Ergocalciferol, (DRISDOL) 1.25 MG (50000 UT) CAPS capsule Take 1 capsule (50,000 Units total)  by mouth every 7 (seven) days. 5 capsule 3   meloxicam (MOBIC) 7.5 MG tablet Take 1 tablet (7.5 mg total) by mouth daily. 10 tablet 0   No facility-administered medications prior to visit.    No Known Allergies  Review of Systems  Constitutional:  Negative for chills, fever and malaise/fatigue.  Respiratory:  Negative for cough and shortness of breath.   Cardiovascular:  Negative for chest pain, palpitations and leg swelling.  Gastrointestinal:  Negative for abdominal pain, blood in stool, constipation, diarrhea, nausea and vomiting.  Musculoskeletal:  Positive for joint pain. Negative for back  pain, falls, myalgias and neck pain.  Skin: Negative.   Neurological:  Positive for tingling and sensory change. Negative for dizziness, tremors, speech change, focal weakness, seizures, loss of consciousness, weakness and headaches.  Psychiatric/Behavioral:  Negative for depression. The patient is not nervous/anxious.   All other systems reviewed and are negative.     Objective:    Physical Exam Vitals reviewed.  Constitutional:      General: She is not in acute distress.    Appearance: Normal appearance. She is normal weight.  HENT:     Head: Normocephalic.  Neck:     Vascular: No carotid bruit.  Cardiovascular:     Rate and Rhythm: Normal rate and regular rhythm.     Pulses: Normal pulses.     Heart sounds: Normal heart sounds.     Comments: No obvious peripheral edema Pulmonary:     Effort: Pulmonary effort is normal.     Breath sounds: Normal breath sounds.  Musculoskeletal:        General: Tenderness present. No swelling, deformity or signs of injury.     Cervical back: Normal range of motion and neck supple. No rigidity or tenderness.     Right lower leg: No edema.     Left lower leg: No edema.     Comments: Limited ROM of RUE. Pain with palpation of diffuse scapula. FROM intact in all other extremities   Lymphadenopathy:     Cervical: No cervical adenopathy.  Skin:    General: Skin is warm and dry.     Capillary Refill: Capillary refill takes less than 2 seconds.  Neurological:     General: No focal deficit present.     Mental Status: She is alert and oriented to person, place, and time.     Cranial Nerves: No cranial nerve deficit.     Sensory: No sensory deficit.     Motor: No weakness.     Coordination: Coordination normal.     Gait: Gait normal.     Deep Tendon Reflexes: Reflexes normal.  Psychiatric:        Mood and Affect: Mood normal.        Behavior: Behavior normal.        Thought Content: Thought content normal.        Judgment: Judgment normal.     BP 114/84    Pulse 72    Temp 98 F (36.7 C)    Ht 5' (1.524 m)    Wt 159 lb (72.1 kg)    SpO2 98%    BMI 31.05 kg/m  Wt Readings from Last 3 Encounters:  01/21/21 159 lb (72.1 kg)  01/03/21 180 lb (81.6 kg)  10/04/19 149 lb 14.6 oz (68 kg)    Immunization History  Administered Date(s) Administered   Influenza,inj,Quad PF,6+ Mos 04/06/2016, 09/26/2017   PFIZER(Purple Top)SARS-COV-2 Vaccination 03/24/2019, 04/14/2019   Tdap 03/03/2018  Diabetic Foot Exam - Simple   No data filed     Lab Results  Component Value Date   TSH 2.330 03/03/2018   Lab Results  Component Value Date   WBC 5.3 10/04/2019   HGB 13.8 10/04/2019   HCT 38.1 10/04/2019   MCV 84.9 10/04/2019   PLT 254 10/04/2019   Lab Results  Component Value Date   NA 136 10/04/2019   K 3.9 10/04/2019   CO2 23 10/04/2019   GLUCOSE 92 10/04/2019   BUN 13 10/04/2019   CREATININE 0.71 10/04/2019   BILITOT 0.2 (L) 10/04/2019   ALKPHOS 81 10/04/2019   AST 22 10/04/2019   ALT 21 10/04/2019   PROT 7.5 10/04/2019   ALBUMIN 4.0 10/04/2019   CALCIUM 8.8 (L) 10/04/2019   ANIONGAP 11 10/04/2019   Lab Results  Component Value Date   CHOL 211 (H) 03/03/2018   CHOL 187 04/07/2016   Lab Results  Component Value Date   HDL 100 03/03/2018   HDL 107 04/07/2016   Lab Results  Component Value Date   LDLCALC 93 03/03/2018   LDLCALC 65 04/07/2016   Lab Results  Component Value Date   TRIG 88 03/03/2018   TRIG 73 04/07/2016   Lab Results  Component Value Date   CHOLHDL 2.1 03/03/2018   CHOLHDL 1.7 04/07/2016   Lab Results  Component Value Date   HGBA1C 5.1 03/03/2018       Assessment & Plan:   Problem List Items Addressed This Visit   None Visit Diagnoses     Right arm pain    -  Primary   Relevant Medications   predniSONE (DELTASONE) 20 MG tablet   ketorolac (TORADOL) 30 MG/ML injection 15 mg (Completed)   meloxicam (MOBIC) 15 MG tablet Concerned for rotator cuff injury v other  tendonitis Informed to take medications as prescribed  Discussed non pharmacological methods for management of pain and RICE Discussed ROM exercises   Other Relevant Orders   AMB referral to orthopedics   Acute pain of right shoulder       Relevant Medications   predniSONE (DELTASONE) 20 MG tablet   ketorolac (TORADOL) 30 MG/ML injection 15 mg (Completed)   meloxicam (MOBIC) 15 MG tablet   Other Relevant Orders   AMB referral to orthopedics   Follow up in 1 mth for wellness visit and reevaluation of symptoms, sooner as needed    I have discontinued Ione Zerby's meloxicam. I am also having her start on predniSONE and meloxicam. Additionally, I am having her maintain her Vitamin D (Ergocalciferol), methocarbamol, diclofenac sodium, and HYDROcodone-acetaminophen. We administered ketorolac.  Meds ordered this encounter  Medications   predniSONE (DELTASONE) 20 MG tablet    Sig: Take 1 tablet (20 mg total) by mouth 2 (two) times daily with a meal for 5 days.    Dispense:  10 tablet    Refill:  0   ketorolac (TORADOL) 30 MG/ML injection 15 mg   meloxicam (MOBIC) 15 MG tablet    Sig: Take 1 tablet (15 mg total) by mouth daily for 14 days.    Dispense:  14 tablet    Refill:  0     Kathrynn Speed, NP

## 2021-01-21 NOTE — Patient Instructions (Signed)
You were seen today in the [facility] for right arm and shoulder pain. You were prescribed medications, please take as directed. Please follow up in 1 mth for annual physical.

## 2021-01-26 ENCOUNTER — Other Ambulatory Visit: Payer: Self-pay

## 2021-01-26 ENCOUNTER — Ambulatory Visit (HOSPITAL_BASED_OUTPATIENT_CLINIC_OR_DEPARTMENT_OTHER)
Admission: RE | Admit: 2021-01-26 | Discharge: 2021-01-26 | Disposition: A | Discharge status: Home / Self Care | Payer: BLUE CROSS/BLUE SHIELD | Source: Ambulatory Visit | Attending: Orthopaedic Surgery | Admitting: Orthopaedic Surgery

## 2021-01-26 ENCOUNTER — Ambulatory Visit (INDEPENDENT_AMBULATORY_CARE_PROVIDER_SITE_OTHER): Payer: BLUE CROSS/BLUE SHIELD | Admitting: Orthopaedic Surgery

## 2021-01-26 ENCOUNTER — Other Ambulatory Visit (HOSPITAL_BASED_OUTPATIENT_CLINIC_OR_DEPARTMENT_OTHER): Payer: Self-pay | Admitting: Orthopaedic Surgery

## 2021-01-26 DIAGNOSIS — M25511 Pain in right shoulder: Secondary | ICD-10-CM | POA: Diagnosis present

## 2021-01-26 DIAGNOSIS — M542 Cervicalgia: Secondary | ICD-10-CM

## 2021-01-26 DIAGNOSIS — S43004A Unspecified dislocation of right shoulder joint, initial encounter: Secondary | ICD-10-CM

## 2021-01-26 DIAGNOSIS — M25561 Pain in right knee: Secondary | ICD-10-CM | POA: Diagnosis not present

## 2021-01-26 DIAGNOSIS — M25562 Pain in left knee: Secondary | ICD-10-CM | POA: Diagnosis not present

## 2021-01-26 NOTE — Progress Notes (Signed)
Chief Complaint: right shoulder pain, dislocation     History of Present Illness:    Shari Gonzales is a 61 y.o. female dominant female presents after right shoulder dislocation that occurred on January 03, 2021.  She had to go to the emergency room to have this relocated.  This dislocated when she was pulling a heavy load of laundry at work.  Denies any previous issues of instability or dislocation.  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  At this time she is here for further evaluation    Surgical History:   None  PMH/PSH/Family History/Social History/Meds/Allergies:    Past Medical History:  Diagnosis Date   Hyperkalemia    Hyperlipidemia    Hypertension    Seizures (HCC)    Vitamin D deficiency    No past surgical history on file. Social History   Socioeconomic History   Marital status: Single    Spouse name: Not on file   Number of children: Not on file   Years of education: Not on file   Highest education level: Not on file  Occupational History   Not on file  Tobacco Use   Smoking status: Never   Smokeless tobacco: Never  Vaping Use   Vaping Use: Never used  Substance and Sexual Activity   Alcohol use: No    Alcohol/week: 0.0 standard drinks   Drug use: Not Currently   Sexual activity: Yes    Birth control/protection: Post-menopausal  Other Topics Concern   Not on file  Social History Narrative   Not on file   Social Determinants of Health   Financial Resource Strain: Not on file  Food Insecurity: Not on file  Transportation Needs: Not on file  Physical Activity: Not on file  Stress: Not on file  Social Connections: Not on file   Family History  Family history unknown: Yes   No Known Allergies Current Outpatient Medications  Medication Sig Dispense Refill   diclofenac sodium (VOLTAREN) 1 % GEL  Apply 2 g topically 4 (four) times daily as needed. 100 g 0   HYDROcodone-acetaminophen (NORCO/VICODIN) 5-325 MG tablet Take 1 tablet by mouth every 6 (six) hours as needed for severe pain. 8 tablet 0   meloxicam (MOBIC) 15 MG tablet Take 1 tablet (15 mg total) by mouth daily for 14 days. 14 tablet 0   methocarbamol (ROBAXIN) 500 MG tablet Take 1 tablet (500 mg total) by mouth every 8 (eight) hours as needed for muscle spasms. 20 tablet 0   predniSONE (DELTASONE) 20 MG tablet Take 1 tablet (20 mg total) by mouth 2 (two) times daily with a meal for 5 days. 10 tablet 0   Vitamin D, Ergocalciferol, (DRISDOL) 1.25 MG (50000 UT) CAPS capsule Take 1 capsule (50,000 Units total) by mouth every 7 (seven) days. 5 capsule 3   No current facility-administered medications for this visit.   No results found.  Review of Systems:   A ROS was performed including pertinent positives and negatives as documented in the HPI.  Physical Exam :   Constitutional: NAD and appears stated age Neurological: Alert and oriented Psych: Appropriate affect and cooperative There were no vitals taken for this visit.  Comprehensive Musculoskeletal Exam:    Musculoskeletal Exam    Inspection Right Left  Skin No atrophy or winging No atrophy or winging  Palpation    Tenderness Glenohumeral none  Range of Motion    Flexion (passive) 170 170  Flexion (active) 170 170  Abduction 170 170  ER at the side 50 70  Can reach behind back to L5 T12  Strength     4/5 5 out of 5  Special Tests    Pseudoparalytic No No  Neurologic    Fires PIN, radial, median, ulnar, musculocutaneous, axillary, suprascapular, long thoracic, and spinal accessory innervated muscles. No abnormal sensibility  Vascular/Lymphatic    Radial Pulse 2+ 2+  Cervical Exam    Patient has symmetric cervical range of motion with negative Spurling's test.  Special Test: Apprehension, 2+ anterior load shift, 1+ posterior load shift     Imaging:   Xray  (3 views right shoulder): Concentrically reduced right shoulder   I personally reviewed and interpreted the radiographs.   Assessment:   61 year old female status post right shoulder dislocation which was subsequently reduced in the emergency room we did discuss that with a shoulder dislocation, I would be concerned about a rotator cuff versus a labral injury.  At this time an MRI would be required to further assess the status of her rotator cuff given the fact that she is not able to return to work and given the acuity of the injury.  Plan :    -Plan for MRI right shoulder  I believe that advance imaging in the form of an MRI is indicated for the following reasons: -Xrays images were obtained and not diagnostic -The patient has failed treatment modalities including rest, ice, NSAIDs, Tylenol -The following worrisome symptoms are present on history and exam: Acute shoulder dislocation concern for possible rotator cuff injury       I personally saw and evaluated the patient, and participated in the management and treatment plan.  Huel Cote, MD Attending Physician, Orthopedic Surgery  This document was dictated using Dragon voice recognition software. A reasonable attempt at proof reading has been made to minimize errors.

## 2021-02-01 ENCOUNTER — Other Ambulatory Visit: Payer: Self-pay | Admitting: Nurse Practitioner

## 2021-02-01 DIAGNOSIS — M79601 Pain in right arm: Secondary | ICD-10-CM

## 2021-02-01 DIAGNOSIS — M25511 Pain in right shoulder: Secondary | ICD-10-CM

## 2021-02-05 ENCOUNTER — Ambulatory Visit (HOSPITAL_BASED_OUTPATIENT_CLINIC_OR_DEPARTMENT_OTHER): Payer: BLUE CROSS/BLUE SHIELD | Attending: Orthopaedic Surgery | Admitting: Physical Therapy

## 2021-02-18 ENCOUNTER — Other Ambulatory Visit: Payer: Self-pay

## 2021-02-18 ENCOUNTER — Other Ambulatory Visit: Payer: Self-pay | Admitting: Nurse Practitioner

## 2021-02-18 DIAGNOSIS — M79601 Pain in right arm: Secondary | ICD-10-CM

## 2021-02-18 DIAGNOSIS — M25511 Pain in right shoulder: Secondary | ICD-10-CM

## 2021-02-18 MED ORDER — MELOXICAM 15 MG PO TABS: ORAL_TABLET | ORAL | 0 refills | Status: DC

## 2021-02-18 NOTE — Therapy (Incomplete)
OUTPATIENT PHYSICAL THERAPY SHOULDER EVALUATION   Patient Name: Shari Gonzales MRN: [id number] DOB:10-28-1960, 60 y.o., female Today's Date: 02/18/2021    Past Medical History:  Diagnosis Date   Hyperkalemia    Hyperlipidemia    Hypertension    Seizures (HCC)    Vitamin D deficiency    No past surgical history on file. Patient Active Problem List   Diagnosis Date Noted   Dental caries 03/07/2018   HTN (hypertension) 07/12/2016   Hot flashes 07/12/2016   Concussion with loss of consciousness 05/14/2015    PCP: Kathrynn Speed, NP  REFERRING PROVIDER: Huel Cote, MD  REFERRING DIAG: [phone] (ICD-10-CM) - Dislocation of right shoulder joint, initial encounter  THERAPY DIAG:  No diagnosis found.   ONSET DATE: 01/03/2021  SUBJECTIVE:                                                                                                                                                                                      SUBJECTIVE STATEMENT:  shoulder dislocation that occurred on January 03, 2021.  She had to go to the emergency room to have this relocated.  This dislocated when she was pulling a heavy load of laundry at work.  Denies any previous issues of instability or dislocation.  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 {yes/no:20286} NPRS scale: ***/10 Pain location: *** Pain orientation: {Pain Orientation:25161}  PAIN TYPE: {type:313116} Pain description: {PAIN DESCRIPTION:21022940}  Aggravating factors: *** Relieving factors: ***  PRECAUTIONS: {Therapy precautions:24002}  WEIGHT BEARING RESTRICTIONS {Yes ***/No:24003}  FALLS:  Has patient fallen in last 6 months? {yes/no:20286} Number of falls: ***  LIVING ENVIRONMENT: Lives with: {[facility] lives with:25569::"lives with their family"} Lives  in: {Lives in:25570} Stairs: {yes/no:20286}; {Stairs:24000} Has following equipment at home: {Assistive devices:23999}  OCCUPATION: ***  PLOF: {PLOF:24004}  PATIENT GOALS ***  OBJECTIVE:   DIAGNOSTIC FINDINGS:  ***  PATIENT SURVEYS:  {rehab surveys:24030:a}  COGNITION:  Overall cognitive status: {cognition:24006}     SENSATION:  Light touch: {intact/deficits:24005}  Stereognosis: {intact/deficits:24005}  Hot/Cold: {intact/deficits:24005}  Proprioception: {intact/deficits:24005}  POSTURE: ***  PALPATION: ***  UPPER EXTREMITY AROM/PROM:  A/PROM Right 02/18/2021 Left 02/18/2021  Shoulder flexion    Shoulder extension    Shoulder abduction    Shoulder adduction    Shoulder internal rotation    Shoulder external rotation    Elbow flexion    Elbow extension    Wrist flexion    Wrist extension    Wrist ulnar  deviation    Wrist radial deviation    Wrist pronation    Wrist supination    (Blank rows = not tested)  UPPER EXTREMITY MMT:  MMT Right 02/18/2021 Left 02/18/2021  Shoulder flexion    Shoulder extension    Shoulder abduction    Shoulder adduction    Shoulder internal rotation    Shoulder external rotation    Middle trapezius    Lower trapezius    Elbow flexion    Elbow extension    Wrist flexion    Wrist extension    Wrist ulnar deviation    Wrist radial deviation    Wrist pronation    Wrist supination    Grip strength (lbs)    (Blank rows = not tested)  SHOULDER SPECIAL TESTS:  Impingement tests: {shoulder impingement test:25231:a}  SLAP lesions: {SLAP lesions:25232}  Instability tests: {shoulder instability test:25233}  Rotator cuff assessment: {rotator cuff assessment:25234}  Biceps assessment: {biceps assessment:25235}  JOINT MOBILITY TESTING:  ***  PALPATION:  ***  POSTURE:  ***   TODAY'S TREATMENT:  ***   PATIENT EDUCATION: Education details: *** Person educated: {Person educated:25204} Education method: {Education  Method:25205} Education comprehension: {Education Comprehension:25206}   HOME EXERCISE PROGRAM: ***  ASSESSMENT:  CLINICAL IMPRESSION: Patient is a *** y.o. *** who was seen today for physical therapy evaluation and treatment for ***. Objective impairments include {opptimpairments:25111}. These impairments are limiting patient from {activity limitations:25113}. Personal factors including {Personal factors:25162} are also affecting patient's functional outcome. Patient will benefit from skilled PT to address above impairments and improve overall function.  REHAB POTENTIAL: {rehabpotential:25112}  CLINICAL DECISION MAKING: {clinical decision making:25114}  EVALUATION COMPLEXITY: {Evaluation complexity:25115}   GOALS: Goals reviewed with patient? {yes/no:20286}  SHORT TERM GOALS:  STG Name Target Date Goal status  1 *** Baseline:  {follow up:25551} {GOALSTATUS:25110}  2 *** Baseline:  {follow up:25551} {GOALSTATUS:25110}  3 *** Baseline: {follow up:25551} {GOALSTATUS:25110}  4 *** Baseline: {follow up:25551} {GOALSTATUS:25110}   LONG TERM GOALS:   LTG Name Target Date Goal status  1 *** Baseline: {follow up:25551} {GOALSTATUS:25110}  2 *** Baseline: {follow up:25551} {GOALSTATUS:25110}  3 *** Baseline: {follow up:25551} {GOALSTATUS:25110}  4 *** Baseline: {follow up:25551} {GOALSTATUS:25110}   PLAN: PT FREQUENCY: {rehab frequency:25116}  PT DURATION: {rehab duration:25117}  PLANNED INTERVENTIONS: {rehab planned interventions:25118::"Therapeutic exercises","Therapeutic activity","Neuro Muscular re-education","Balance training","Gait training","Patient/Family education","Joint mobilization"}  PLAN FOR NEXT SESSION: Zebedee Iba, PT 02/18/2021, 5:34 PM

## 2021-02-19 ENCOUNTER — Ambulatory Visit (HOSPITAL_BASED_OUTPATIENT_CLINIC_OR_DEPARTMENT_OTHER): Payer: BLUE CROSS/BLUE SHIELD | Attending: Orthopaedic Surgery | Admitting: Physical Therapy

## 2021-02-19 ENCOUNTER — Encounter (HOSPITAL_BASED_OUTPATIENT_CLINIC_OR_DEPARTMENT_OTHER): Payer: Self-pay | Admitting: Physical Therapy

## 2021-02-19 ENCOUNTER — Ambulatory Visit (HOSPITAL_BASED_OUTPATIENT_CLINIC_OR_DEPARTMENT_OTHER): Payer: BLUE CROSS/BLUE SHIELD | Admitting: Orthopaedic Surgery

## 2021-02-19 ENCOUNTER — Other Ambulatory Visit: Payer: Self-pay

## 2021-02-19 DIAGNOSIS — M6281 Muscle weakness (generalized): Secondary | ICD-10-CM | POA: Insufficient documentation

## 2021-02-19 DIAGNOSIS — M25511 Pain in right shoulder: Secondary | ICD-10-CM | POA: Diagnosis not present

## 2021-02-19 NOTE — Therapy (Signed)
OUTPATIENT PHYSICAL THERAPY SHOULDER EVALUATION   Patient Name: Shari Gonzales MRN: [id number] DOB:[birth year], 61 y.o., female Today's Date: 02/19/2021   PT End of Session - 02/19/21 1104     Visit Number 1    Number of Visits 17    Date for PT Re-Evaluation 04/17/21    Authorization Type [employer]    PT Start Time 1101    PT Stop Time 1140    PT Time Calculation (min) 39 min    Activity Tolerance Patient tolerated treatment well    Behavior During Therapy WFL for tasks assessed/performed             Past Medical History:  Diagnosis Date   Hyperkalemia    Hyperlipidemia    Hypertension    Seizures (HCC)    Vitamin D deficiency    History reviewed. No pertinent surgical history. Patient Active Problem List   Diagnosis Date Noted   Dental caries 03/07/2018   HTN (hypertension) 07/12/2016   Hot flashes 07/12/2016   Concussion with loss of consciousness 05/14/2015    PCP: Kathrynn Speed, NP  REFERRING PROVIDER: Huel Cote, MD  REFERRING DIAG: [phone] (ICD-10-CM) - Dislocation of right shoulder joint, initial encounter  THERAPY DIAG:  Acute pain of right shoulder  Muscle weakness (generalized)   ONSET DATE: 01/03/21  SUBJECTIVE:                                                                                                                                                                                      SUBJECTIVE STATEMENT:  shoulder dislocation that occurred on January 03, 2021 by pulling a heavy load of laundry at work- ER visit to reduce.  Denies any previous issues of instability or dislocation per MD note. It came back and hurts all the way down the arm. Some tingling. I am on MD leave right now.    PERTINENT HISTORY: Seizures, vit D deficiency   PAIN:  Are you having pain? Yes NPRS scale: 10/10 Pain location: Rt UE Pain description: constant  Aggravating factors: sleeping on Rt side Relieving factors: holding it up, exercises ER told me  to do  PRECAUTIONS: None  WEIGHT BEARING RESTRICTIONS No  FALLS:  Has patient fallen in last 6 months? No Number of falls: 0  LIVING ENVIRONMENT: Lives with: lives with their family   OCCUPATION: Regulatory affairs officer at [employer]  PLOF: Independent  PATIENT GOALS get back to work, decr pain  OBJECTIVE:   DIAGNOSTIC FINDINGS:  Scheduled for MRI  PATIENT SURVEYS:  FOTO 45  COGNITION:  Overall cognitive status: Within functional limits for tasks assessed     SENSATION:  Reports some numbness and  shooting pain  POSTURE: Rt shoulder elevation at rest  PALPATION: Gross TTP  UPPER EXTREMITY AROM/PROM:  A/PROM Right 02/19/2021 Left 02/19/2021  Shoulder flexion 120   Shoulder extension    Shoulder abduction 80   Shoulder adduction    Shoulder internal rotation    Shoulder external rotation    Elbow flexion    Elbow extension    Wrist flexion    Wrist extension    Wrist ulnar deviation    Wrist radial deviation    Wrist pronation    Wrist supination    (Blank rows = not tested)  UPPER EXTREMITY MMT: all shoulder MMT painful limiting measurements  MMT Right 02/19/2021 Left 02/19/2021  Shoulder flexion    Shoulder extension    Shoulder abduction    Shoulder adduction    Shoulder internal rotation    Shoulder external rotation    Middle trapezius    Lower trapezius    Elbow flexion WFL   Elbow extension WFL   Wrist flexion    Wrist extension    Wrist ulnar deviation    Wrist radial deviation    Wrist pronation    Wrist supination    Grip strength (lbs)    (Blank rows = not tested)       TODAY'S TREATMENT:  MANUAL- STM Rt upper trap AAROM flexion with dowel, within tolerable range   PATIENT EDUCATION: Education details: Teacher, music of condition, POC, HEP, exercise form/rationale  Person educated: Patient Education method: Explanation, Demonstration, Tactile cues, Verbal cues, and Handouts Education comprehension: verbalized understanding, returned  demonstration, verbal cues required, tactile cues required, and needs further education   HOME EXERCISE PROGRAM: [email or web address]  ASSESSMENT:  CLINICAL IMPRESSION: Patient is a 61 y.o. F who was seen today for physical therapy evaluation and treatment for Rt shoulder dislocation. Passively her ROM had an empty end feel with pain so passive motions were not pushed to end range for measurement. Pt reported feeling less tight after manual therapy to upper trap and we discussed use of dry needling. Planning to have an MRI soon to look at soft tissue damage. No concerns for neurological issues today-N/t appears to be nonspecific referral patterns. Pt will benefit from skilled PT for pain control and retraining of function to return to work.   Objective impairments include decreased activity tolerance, decreased ROM, decreased strength, increased muscle spasms, impaired flexibility, impaired sensation, impaired UE functional use, and pain. These impairments are limiting patient from cleaning, community activity, driving, meal prep, occupation, laundry, and shopping. Personal factors including 1 comorbidity: seizures of unknown origin or triggers  are also affecting patient's functional outcome. Patient will benefit from skilled PT to address above impairments and improve overall function.  REHAB POTENTIAL: Good  CLINICAL DECISION MAKING: Stable/uncomplicated  EVALUATION COMPLEXITY: Low   GOALS: Goals reviewed with patient? Yes  SHORT TERM GOALS:  STG Name Target Date Goal status  1 Will demo proper form with HEP as it has been established in the short term Baseline: will progress as appropriate 03/05/2021 INITIAL  2 Decreased pain to average of 7/10 Baseline: reporting 10/10 at eval 03/12/2021 INITIAL                            LONG TERM GOALS:   LTG Name Target Date Goal status  1 Pt will return to work with ability to complete job duties Baseline: unable to use arm to lift due to pain  right now 04/17/21  INITIAL  2 Able to sleep without waking due to pain Baseline: unable at eval 04/17/21 INITIAL  3 FOTO to 60 function Baseline: 45 at eval 04/17/21 INITIAL  4 Pt will demo at least 90% strength via dynamometry testing vs Lt UE Baseline:not appropriate to test at eval due to pain levels 04/17/21 INITIAL                  PLAN: PT FREQUENCY: 1-2x/week  PT DURATION: 8 weeks  PLANNED INTERVENTIONS: Therapeutic exercises, Therapeutic activity, Neuro Muscular re-education, Patient/Family education, Joint mobilization, Aquatic Therapy, Dry Needling, Electrical stimulation, Spinal mobilization, Cryotherapy, Moist heat, Taping, Traction, Ionotophoresis 4mg /ml Dexamethasone, and Manual therapy  PLAN FOR NEXT SESSION: cont STM & periscap stabilization  Janette Harvie C. Ryliee Figge PT, DPT 02/19/21 2:48 PM

## 2021-02-23 ENCOUNTER — Ambulatory Visit (INDEPENDENT_AMBULATORY_CARE_PROVIDER_SITE_OTHER): Payer: BLUE CROSS/BLUE SHIELD | Admitting: Nurse Practitioner

## 2021-02-23 ENCOUNTER — Other Ambulatory Visit: Payer: Self-pay

## 2021-02-23 ENCOUNTER — Encounter: Payer: Self-pay | Admitting: Nurse Practitioner

## 2021-02-23 VITALS — BP 168/106 | HR 65 | Temp 98.0°F | Ht 60.0 in | Wt 159.5 lb

## 2021-02-23 DIAGNOSIS — R030 Elevated blood-pressure reading, without diagnosis of hypertension: Secondary | ICD-10-CM

## 2021-02-23 DIAGNOSIS — Z129 Encounter for screening for malignant neoplasm, site unspecified: Secondary | ICD-10-CM | POA: Diagnosis not present

## 2021-02-23 DIAGNOSIS — Z0000 Encounter for general adult medical examination without abnormal findings: Secondary | ICD-10-CM | POA: Diagnosis not present

## 2021-02-23 NOTE — Progress Notes (Signed)
[facility] Patient [facility] [address] [place], [place]  [address] Phone:  [phone]   Fax:  [phone] Subjective:   Patient ID: Shari Gonzales, female    DOB: 1960/07/28, 61 y.o.   MRN: [id number]  Chief Complaint  Patient presents with   Annual Exam    Pt is here for annual wellness. Pt stated she still has Rt arm and shoulder pain.   HPI Shari Gonzales 61 y.o. female  has a past medical history of Hyperkalemia, Hyperlipidemia, Hypertension, Seizures (HCC), and Vitamin D deficiency. To the [facility]  for wellness visit and reevaluation of right shoulder pain.  Patient states that she has been regularly attending PT and completed appointment with orthopedics. Has been taking prescribed pain medications with only mild temporary improvement in pain. Currently rates pain 10/10 and describes as sharp. Pain increases with movement. Denies any improving factors. Optimistic about current treatment plan, however; requesting possible medical leave. Denies any other complaints today.   Denies any fever. Denies any fatigue, chest pain, shortness of breath, HA or dizziness. Denies any blurred vision, numbness or tingling.   Past Medical History:  Diagnosis Date   Hyperkalemia    Hyperlipidemia    Hypertension    Seizures (HCC)    Vitamin D deficiency     History reviewed. No pertinent surgical history.  Family History  Family history unknown: Yes    Social History   Socioeconomic History   Marital status: Single    Spouse name: Not on file   Number of children: Not on file   Years of education: Not on file   Highest education level: Not on file  Occupational History   Not on file  Tobacco Use   Smoking status: Never   Smokeless tobacco: Never  Vaping Use   Vaping Use: Never used  Substance and Sexual Activity   Alcohol use: No    Alcohol/week: 0.0 standard drinks   Drug use: Not Currently   Sexual activity: Yes    Birth control/protection: Post-menopausal   Other Topics Concern   Not on file  Social History Narrative   Not on file   Social Determinants of Health   Financial Resource Strain: Not on file  Food Insecurity: Not on file  Transportation Needs: Not on file  Physical Activity: Not on file  Stress: Not on file  Social Connections: Not on file  Intimate Partner Violence: Not on file    Outpatient Medications Prior to Visit  Medication Sig Dispense Refill   diclofenac sodium (VOLTAREN) 1 % GEL Apply 2 g topically 4 (four) times daily as needed. 100 g 0   HYDROcodone-acetaminophen (NORCO/VICODIN) 5-325 MG tablet Take 1 tablet by mouth every 6 (six) hours as needed for severe pain. 8 tablet 0   meloxicam (MOBIC) 15 MG tablet TAKE 1 TABLET(15 MG) BY MOUTH DAILY FOR 14 DAYS Strength: 15 mg 14 tablet 0   methocarbamol (ROBAXIN) 500 MG tablet Take 1 tablet (500 mg total) by mouth every 8 (eight) hours as needed for muscle spasms. 20 tablet 0   Vitamin D, Ergocalciferol, (DRISDOL) 1.25 MG (50000 UT) CAPS capsule Take 1 capsule (50,000 Units total) by mouth every 7 (seven) days. 5 capsule 3   No facility-administered medications prior to visit.    No Known Allergies  Review of Systems  Constitutional:  Negative for chills, fever and malaise/fatigue.  HENT: Negative.    Eyes: Negative.   Respiratory:  Negative for cough and shortness of breath.  Cardiovascular:  Negative for chest pain, palpitations and leg swelling.  Gastrointestinal:  Negative for abdominal pain, blood in stool, constipation, diarrhea, nausea and vomiting.  Genitourinary: Negative.   Musculoskeletal:  Positive for joint pain. Negative for back pain, falls, myalgias and neck pain.  Skin: Negative.   Neurological: Negative.   Psychiatric/Behavioral:  Negative for depression. The patient is not nervous/anxious.   All other systems reviewed and are negative.     Objective:    Physical Exam Vitals reviewed.  Constitutional:      General: She is not in acute  distress.    Appearance: Normal appearance. She is normal weight.  HENT:     Head: Normocephalic.     Right Ear: Tympanic membrane, ear canal and external ear normal.     Left Ear: Tympanic membrane, ear canal and external ear normal.     Nose: Nose normal.     Mouth/Throat:     Mouth: Mucous membranes are moist.     Pharynx: Oropharynx is clear.  Eyes:     Extraocular Movements: Extraocular movements intact.     Conjunctiva/sclera: Conjunctivae normal.     Pupils: Pupils are equal, round, and reactive to light.  Neck:     Vascular: No carotid bruit.  Cardiovascular:     Rate and Rhythm: Normal rate and regular rhythm.     Pulses: Normal pulses.     Heart sounds: Normal heart sounds.     Comments: No obvious peripheral edema Pulmonary:     Effort: Pulmonary effort is normal.     Breath sounds: Normal breath sounds.  Abdominal:     General: Abdomen is flat. Bowel sounds are normal. There is no distension.     Palpations: Abdomen is soft. There is no mass.     Tenderness: There is no abdominal tenderness. There is no right CVA tenderness, left CVA tenderness, guarding or rebound.     Hernia: No hernia is present.  Musculoskeletal:        General: No swelling, tenderness, deformity or signs of injury. Normal range of motion.     Cervical back: Normal range of motion and neck supple. No rigidity or tenderness.     Right lower leg: No edema.     Left lower leg: No edema.  Lymphadenopathy:     Cervical: No cervical adenopathy.  Skin:    General: Skin is warm and dry.     Capillary Refill: Capillary refill takes less than 2 seconds.  Neurological:     General: No focal deficit present.     Mental Status: She is alert and oriented to person, place, and time.  Psychiatric:        Mood and Affect: Mood normal.        Behavior: Behavior normal.        Thought Content: Thought content normal.        Judgment: Judgment normal.    BP (!) 168/106    Pulse 65    Temp 98 F (36.7 C)     Ht 5' (1.524 m)    Wt 159 lb 8 oz (72.3 kg)    SpO2 100%    BMI 31.15 kg/m  Wt Readings from Last 3 Encounters:  02/23/21 159 lb 8 oz (72.3 kg)  01/21/21 159 lb (72.1 kg)  01/03/21 180 lb (81.6 kg)    Immunization History  Administered Date(s) Administered   Influenza,inj,Quad PF,6+ Mos 04/06/2016, 09/26/2017   Influenza-Unspecified 11/05/2020   PFIZER(Purple Top)SARS-COV-2 Vaccination 03/24/2019,  04/14/2019   Tdap 03/03/2018    Diabetic Foot Exam - Simple   No data filed     Lab Results  Component Value Date   TSH 2.330 03/03/2018   Lab Results  Component Value Date   WBC 5.3 10/04/2019   HGB 13.8 10/04/2019   HCT 38.1 10/04/2019   MCV 84.9 10/04/2019   PLT 254 10/04/2019   Lab Results  Component Value Date   NA 136 10/04/2019   K 3.9 10/04/2019   CO2 23 10/04/2019   GLUCOSE 92 10/04/2019   BUN 13 10/04/2019   CREATININE 0.71 10/04/2019   BILITOT 0.2 (L) 10/04/2019   ALKPHOS 81 10/04/2019   AST 22 10/04/2019   ALT 21 10/04/2019   PROT 7.5 10/04/2019   ALBUMIN 4.0 10/04/2019   CALCIUM 8.8 (L) 10/04/2019   ANIONGAP 11 10/04/2019   Lab Results  Component Value Date   CHOL 211 (H) 03/03/2018   CHOL 187 04/07/2016   Lab Results  Component Value Date   HDL 100 03/03/2018   HDL 107 04/07/2016   Lab Results  Component Value Date   LDLCALC 93 03/03/2018   LDLCALC 65 04/07/2016   Lab Results  Component Value Date   TRIG 88 03/03/2018   TRIG 73 04/07/2016   Lab Results  Component Value Date   CHOLHDL 2.1 03/03/2018   CHOLHDL 1.7 04/07/2016   Lab Results  Component Value Date   HGBA1C 5.1 03/03/2018       Assessment & Plan:   Problem List Items Addressed This Visit   None Visit Diagnoses     Elevated BP without diagnosis of hypertension    -  Primary Suspect elevation related to patient pain in right shoulder, chart review indicates normal trending B/P  Informed to continue taking prescribed medication for pain, in addition to OTC  medications as needed    Screening for cancer       Relevant Orders   MM Digital Screening   Ambulatory referral to Gastroenterology   Encounter for wellness examination in adult       Relevant Orders   CBC with Differential/Platelet   Comprehensive metabolic panel   Hemoglobin A1c   Lipid panel Discussed diet and exercise Encouraged continued participation in PT sessions    Follow up in 1 mth for pap smear, sooner as needed     I am having Jacqualine Code maintain her Vitamin D (Ergocalciferol), methocarbamol, diclofenac sodium, HYDROcodone-acetaminophen, and meloxicam.  No orders of the defined types were placed in this encounter.    Kathrynn Speed, NP

## 2021-02-23 NOTE — Patient Instructions (Signed)
You were seen today in the [facility] for reevaluation of right shoulder pain. Labs were collected, results will be available via MyChart or, if abnormal, you will be contacted by clinic staff. You were prescribed medications, please take as directed. Please follow up in 1 mth for pap smear.  Please come back with FMLA paperwork for medical leave

## 2021-02-24 LAB — CBC WITH DIFFERENTIAL/PLATELET
Basophils Absolute: 0 10*3/uL (ref 0.0–0.2)
Basos: 0 %
EOS (ABSOLUTE): 0.1 10*3/uL (ref 0.0–0.4)
Eos: 1 %
Hematocrit: 42.8 % (ref 34.0–46.6)
Hemoglobin: 14.4 g/dL (ref 11.1–15.9)
Immature Grans (Abs): 0 10*3/uL (ref 0.0–0.1)
Immature Granulocytes: 0 %
Lymphocytes Absolute: 2 10*3/uL (ref 0.7–3.1)
Lymphs: 41 %
MCH: 30.2 pg (ref 26.6–33.0)
MCHC: 33.6 g/dL (ref 31.5–35.7)
MCV: 90 fL (ref 79–97)
Monocytes Absolute: 0.3 10*3/uL (ref 0.1–0.9)
Monocytes: 7 %
Neutrophils Absolute: 2.4 10*3/uL (ref 1.4–7.0)
Neutrophils: 51 %
Platelets: 235 10*3/uL (ref 150–450)
RBC: 4.77 x10E6/uL (ref 3.77–5.28)
RDW: 14.7 % (ref 11.7–15.4)
WBC: 4.8 10*3/uL (ref 3.4–10.8)

## 2021-02-24 LAB — LIPID PANEL
Chol/HDL Ratio: 2.2 ratio (ref 0.0–4.4)
Cholesterol, Total: 202 mg/dL — ABNORMAL HIGH (ref 100–199)
HDL: 90 mg/dL (ref >39)
LDL Chol Calc (NIH): 96 mg/dL (ref 0–99)
Triglycerides: 93 mg/dL (ref 0–149)
VLDL Cholesterol Cal: 16 mg/dL (ref 5–40)

## 2021-02-24 LAB — COMPREHENSIVE METABOLIC PANEL
ALT: 17 IU/L (ref 0–32)
AST: 17 IU/L (ref 0–40)
Albumin/Globulin Ratio: 1.5 (ref 1.2–2.2)
Albumin: 4.6 g/dL (ref 3.8–4.9)
Alkaline Phosphatase: 98 IU/L (ref 44–121)
BUN/Creatinine Ratio: 23 (ref 9–23)
BUN: 14 mg/dL (ref 6–24)
Bilirubin Total: 0.3 mg/dL (ref 0.0–1.2)
CO2: 24 mmol/L (ref 20–29)
Calcium: 9.6 mg/dL (ref 8.7–10.2)
Chloride: 104 mmol/L (ref 96–106)
Creatinine, Ser: 0.62 mg/dL (ref 0.57–1.00)
Globulin, Total: 3 g/dL (ref 1.5–4.5)
Glucose: 82 mg/dL (ref 70–99)
Potassium: 4 mmol/L (ref 3.5–5.2)
Sodium: 142 mmol/L (ref 134–144)
Total Protein: 7.6 g/dL (ref 6.0–8.5)
eGFR: 103 mL/min/{1.73_m2} (ref >59)

## 2021-02-24 LAB — HEMOGLOBIN A1C
Est. average glucose Bld gHb Est-mCnc: 111 mg/dL
Hgb A1c MFr Bld: 5.5 % (ref 4.8–5.6)

## 2021-02-25 ENCOUNTER — Encounter (HOSPITAL_BASED_OUTPATIENT_CLINIC_OR_DEPARTMENT_OTHER): Payer: Self-pay | Admitting: Physical Therapy

## 2021-02-27 ENCOUNTER — Ambulatory Visit (HOSPITAL_BASED_OUTPATIENT_CLINIC_OR_DEPARTMENT_OTHER): Payer: BLUE CROSS/BLUE SHIELD | Admitting: Physical Therapy

## 2021-02-27 NOTE — Therapy (Incomplete)
OUTPATIENT PHYSICAL THERAPY TREATMENT NOTE   Patient Name: Shari Gonzales MRN: [id number] DOB:21-Feb-1960, 61 y.o., female Today's Date: 02/27/2021  PCP: Kathrynn Speed, NP REFERRING PROVIDER: Kathrynn Speed, NP    Past Medical History:  Diagnosis Date   Hyperkalemia    Hyperlipidemia    Hypertension    Seizures (HCC)    Vitamin D deficiency    No past surgical history on file. Patient Active Problem List   Diagnosis Date Noted   Dental caries 03/07/2018   HTN (hypertension) 07/12/2016   Hot flashes 07/12/2016   Concussion with loss of consciousness 05/14/2015    REFERRING DIAG:  S43.004A (ICD-10-CM) - Dislocation of right shoulder joint, initial encounter  THERAPY DIAG:  No diagnosis found.  PERTINENT HISTORY: Seizures, vit D deficiency   PRECAUTIONS: none  SUBJECTIVE: ***  PAIN:  Are you having pain? {yes/no:20286} NPRS scale: ***/10 Pain location: *** Pain orientation: {Pain Orientation:25161}  PAIN TYPE: {type:313116} Pain description: {PAIN DESCRIPTION:21022940}  Aggravating factors: *** Relieving factors: ***   OBJECTIVE:    DIAGNOSTIC FINDINGS:  Scheduled for MRI   PATIENT SURVEYS:  FOTO 45   COGNITION:          Overall cognitive status: Within functional limits for tasks assessed                               SENSATION:          Reports some numbness and shooting pain   POSTURE: Rt shoulder elevation at rest   PALPATION: Gross TTP   UPPER EXTREMITY AROM/PROM:   A/PROM Right 02/19/2021 Left 02/19/2021  Shoulder flexion 120    Shoulder extension      Shoulder abduction 80    Shoulder adduction      Shoulder internal rotation      Shoulder external rotation      Elbow flexion      Elbow extension      Wrist flexion      Wrist extension      Wrist ulnar deviation      Wrist radial deviation      Wrist pronation      Wrist supination      (Blank rows = not tested)   UPPER EXTREMITY MMT: all shoulder MMT painful  limiting measurements   MMT Right 02/19/2021 Left 02/19/2021  Shoulder flexion      Shoulder extension      Shoulder abduction      Shoulder adduction      Shoulder internal rotation      Shoulder external rotation      Middle trapezius      Lower trapezius      Elbow flexion WFL    Elbow extension WFL    Wrist flexion      Wrist extension      Wrist ulnar deviation      Wrist radial deviation      Wrist pronation      Wrist supination      Grip strength (lbs)      (Blank rows = not tested)                    TODAY'S TREATMENT:  MANUAL- STM Rt upper trap AAROM flexion with dowel, within tolerable range     PATIENT EDUCATION: Education details: Teacher, music of condition, POC, HEP, exercise form/rationale   Person educated: Patient Education method: Explanation, Demonstration, Tactile cues, Verbal cues,  and Handouts Education comprehension: verbalized understanding, returned demonstration, verbal cues required, tactile cues required, and needs further education     HOME EXERCISE PROGRAM: [email or web address]   ASSESSMENT:   CLINICAL IMPRESSION: ***  Patient is a 61 y.o. F who was seen today for physical therapy evaluation and treatment for Rt shoulder dislocation. Passively her ROM had an empty end feel with pain so passive motions were not pushed to end range for measurement. Pt reported feeling less tight after manual therapy to upper trap and we discussed use of dry needling. Planning to have an MRI soon to look at soft tissue damage. No concerns for neurological issues today-N/t appears to be nonspecific referral patterns. Pt will benefit from skilled PT for pain control and retraining of function to return to work.    Objective impairments include decreased activity tolerance, decreased ROM, decreased strength, increased muscle spasms, impaired flexibility, impaired sensation, impaired UE functional use, and pain. These impairments are limiting patient from cleaning, community  activity, driving, meal prep, occupation, laundry, and shopping. Personal factors including 1 comorbidity: seizures of unknown origin or triggers  are also affecting patient's functional outcome. Patient will benefit from skilled PT to address above impairments and improve overall function.   REHAB POTENTIAL: Good   CLINICAL DECISION MAKING: Stable/uncomplicated   EVALUATION COMPLEXITY: Low     GOALS: Goals reviewed with patient? Yes   SHORT TERM GOALS:   STG Name Target Date Goal status  1 Will demo proper form with HEP as it has been established in the short term Baseline: will progress as appropriate 03/05/2021 INITIAL  2 Decreased pain to average of 7/10 Baseline: reporting 10/10 at eval 03/12/2021 INITIAL                                                 LONG TERM GOALS:    LTG Name Target Date Goal status  1 Pt will return to work with ability to complete job duties Baseline: unable to use arm to lift due to pain right now 04/17/21 INITIAL  2 Able to sleep without waking due to pain Baseline: unable at eval 04/17/21 INITIAL  3 FOTO to 60 function Baseline: 45 at eval 04/17/21 INITIAL  4 Pt will demo at least 90% strength via dynamometry testing vs Lt UE Baseline:not appropriate to test at eval due to pain levels 04/17/21 INITIAL                               PLAN: PT FREQUENCY: 1-2x/week   PT DURATION: 8 weeks   PLANNED INTERVENTIONS: Therapeutic exercises, Therapeutic activity, Neuro Muscular re-education, Patient/Family education, Joint mobilization, Aquatic Therapy, Dry Needling, Electrical stimulation, Spinal mobilization, Cryotherapy, Moist heat, Taping, Traction, Ionotophoresis 4mg /ml Dexamethasone, and Manual therapy   PLAN FOR NEXT SESSION: cont STM & periscap stabilization      , PT 02/27/2021, 9:45 AM

## 2021-03-03 ENCOUNTER — Other Ambulatory Visit: Payer: BLUE CROSS/BLUE SHIELD

## 2021-03-04 ENCOUNTER — Encounter (HOSPITAL_BASED_OUTPATIENT_CLINIC_OR_DEPARTMENT_OTHER): Payer: Self-pay | Admitting: Physical Therapy

## 2021-03-04 ENCOUNTER — Other Ambulatory Visit: Payer: Self-pay

## 2021-03-04 ENCOUNTER — Ambulatory Visit: Payer: BLUE CROSS/BLUE SHIELD

## 2021-03-04 ENCOUNTER — Ambulatory Visit (HOSPITAL_BASED_OUTPATIENT_CLINIC_OR_DEPARTMENT_OTHER): Payer: BLUE CROSS/BLUE SHIELD | Admitting: Physical Therapy

## 2021-03-04 DIAGNOSIS — M25511 Pain in right shoulder: Secondary | ICD-10-CM

## 2021-03-04 DIAGNOSIS — M6281 Muscle weakness (generalized): Secondary | ICD-10-CM

## 2021-03-04 NOTE — Therapy (Signed)
OUTPATIENT PHYSICAL THERAPY TREATMENT NOTE   Patient Name: Shari Gonzales MRN: [id number] DOB:July 30, 1960, 61 y.o., female Today's Date: 03/04/2021  PCP: Kathrynn Speed, NP REFERRING PROVIDER: Kathrynn Speed, NP   PT End of Session - 03/04/21 1104     Visit Number 2    Number of Visits 17    Date for PT Re-Evaluation 04/17/21    Authorization Type BCBS    PT Start Time 1102             Past Medical History:  Diagnosis Date   Hyperkalemia    Hyperlipidemia    Hypertension    Seizures (HCC)    Vitamin D deficiency    History reviewed. No pertinent surgical history. Patient Active Problem List   Diagnosis Date Noted   Dental caries 03/07/2018   HTN (hypertension) 07/12/2016   Hot flashes 07/12/2016   Concussion with loss of consciousness 05/14/2015    REFERRING DIAG: S43.004A (ICD-10-CM) - Dislocation of right shoulder joint, initial encounter  THERAPY DIAG:  Acute pain of right shoulder  Muscle weakness (generalized)  PERTINENT HISTORY: Seizures, vit D deficiency   PRECAUTIONS: none  SUBJECTIVE: throbbing into arm  PAIN:  Are you having pain? Yes NPRS scale: 10/10 Pain location: Rt UE  Pain description:  throbbing   Aggravating factors: constatnt pain Relieving factors: exercises feel good   OBJECTIVE:    DIAGNOSTIC FINDINGS:  Scheduled for MRI- going today   PATIENT SURVEYS:  FOTO 45   COGNITION:          Overall cognitive status: Within functional limits for tasks assessed                               SENSATION:          Reports some numbness and shooting pain   POSTURE: Rt shoulder elevation at rest   PALPATION: Gross TTP   UPPER EXTREMITY AROM/PROM:   A/PROM Right 02/19/2021 Left 02/19/2021  Shoulder flexion 120    Shoulder extension      Shoulder abduction 80    Shoulder adduction      Shoulder internal rotation      Shoulder external rotation      Elbow flexion      Elbow extension      Wrist flexion      Wrist  extension      Wrist ulnar deviation      Wrist radial deviation      Wrist pronation      Wrist supination      (Blank rows = not tested)   UPPER EXTREMITY MMT: all shoulder MMT painful limiting measurements   MMT Right 02/19/2021 Left 02/19/2021  Shoulder flexion      Shoulder extension      Shoulder abduction      Shoulder adduction      Shoulder internal rotation      Shoulder external rotation      Middle trapezius      Lower trapezius      Elbow flexion WFL    Elbow extension WFL    Wrist flexion      Wrist extension      Wrist ulnar deviation      Wrist radial deviation      Wrist pronation      Wrist supination      Grip strength (lbs)      (Blank rows = not tested)  TODAY'S TREATMENT:  2/15: MANUAL: trigger point release around Rt shoulder and scapula; PROM Rt shoulder  Supine isometric scap retraction Standing isometrics all directions Seated shoulder rolls PT resisted scap retraction  EVAL: MANUAL- STM Rt upper trap AAROM flexion with dowel, within tolerable range     PATIENT EDUCATION: Education details: Teacher, music of condition, POC, HEP, exercise form/rationale   Person educated: Patient Education method: Explanation, Demonstration, Tactile cues, Verbal cues, and Handouts Education comprehension: verbalized understanding, returned demonstration, verbal cues required, tactile cues required, and needs further education     HOME EXERCISE PROGRAM: [email or web address]   ASSESSMENT:   CLINICAL IMPRESSION: Trigger points in Rt shoulder decreased concordant pain following manual therapy. Very guarded due to fear and tends to elevate shoulder and rest in cervical sidebend to the Right. Was able to achieve appropriate scpular motion with training today.     Objective impairments include decreased activity tolerance, decreased ROM, decreased strength, increased muscle spasms, impaired flexibility, impaired sensation, impaired UE functional use, and  pain. These impairments are limiting patient from cleaning, community activity, driving, meal prep, occupation, laundry, and shopping. Personal factors including 1 comorbidity: seizures of unknown origin or triggers  are also affecting patient's functional outcome. Patient will benefit from skilled PT to address above impairments and improve overall function.   REHAB POTENTIAL: Good   CLINICAL DECISION MAKING: Stable/uncomplicated   EVALUATION COMPLEXITY: Low     GOALS: Goals reviewed with patient? Yes   SHORT TERM GOALS:   STG Name Target Date Goal status  1 Will demo proper form with HEP as it has been established in the short term Baseline: will progress as appropriate 03/05/2021 Achieved      2/15  2 Decreased pain to average of 7/10 Baseline: reporting 10/10 at eval 03/12/2021 INITIAL                                                 LONG TERM GOALS:    LTG Name Target Date Goal status  1 Pt will return to work with ability to complete job duties Baseline: unable to use arm to lift due to pain right now 04/17/21 INITIAL  2 Able to sleep without waking due to pain Baseline: unable at eval 04/17/21 INITIAL  3 FOTO to 60 function Baseline: 45 at eval 04/17/21 INITIAL  4 Pt will demo at least 90% strength via dynamometry testing vs Lt UE Baseline:not appropriate to test at eval due to pain levels 04/17/21 INITIAL                               PLAN: PT FREQUENCY: 1-2x/week   PT DURATION: 8 weeks   PLANNED INTERVENTIONS: Therapeutic exercises, Therapeutic activity, Neuro Muscular re-education, Patient/Family education, Joint mobilization, Aquatic Therapy, Dry Needling, Electrical stimulation, Spinal mobilization, Cryotherapy, Moist heat, Taping, Traction, Ionotophoresis 4mg /ml Dexamethasone, and Manual therapy   PLAN FOR NEXT SESSION: cont STM & periscap stabilization     Roniya Tetro C. Alik Mawson PT, DPT 03/04/21 11:52 AM

## 2021-03-06 ENCOUNTER — Ambulatory Visit (HOSPITAL_BASED_OUTPATIENT_CLINIC_OR_DEPARTMENT_OTHER): Payer: BLUE CROSS/BLUE SHIELD | Admitting: Orthopaedic Surgery

## 2021-03-08 ENCOUNTER — Other Ambulatory Visit: Payer: Self-pay | Admitting: Nurse Practitioner

## 2021-03-08 DIAGNOSIS — M25511 Pain in right shoulder: Secondary | ICD-10-CM

## 2021-03-08 DIAGNOSIS — M79601 Pain in right arm: Secondary | ICD-10-CM

## 2021-03-10 ENCOUNTER — Other Ambulatory Visit: Payer: Self-pay

## 2021-03-10 ENCOUNTER — Ambulatory Visit (HOSPITAL_BASED_OUTPATIENT_CLINIC_OR_DEPARTMENT_OTHER)
Admission: RE | Admit: 2021-03-10 | Discharge: 2021-03-10 | Disposition: A | Discharge status: Home / Self Care | Payer: BLUE CROSS/BLUE SHIELD | Source: Ambulatory Visit | Attending: Nurse Practitioner | Admitting: Nurse Practitioner

## 2021-03-10 ENCOUNTER — Encounter (HOSPITAL_BASED_OUTPATIENT_CLINIC_OR_DEPARTMENT_OTHER): Payer: Self-pay | Admitting: Radiology

## 2021-03-10 DIAGNOSIS — Z1231 Encounter for screening mammogram for malignant neoplasm of breast: Secondary | ICD-10-CM | POA: Diagnosis not present

## 2021-03-10 DIAGNOSIS — Z129 Encounter for screening for malignant neoplasm, site unspecified: Secondary | ICD-10-CM

## 2021-03-11 ENCOUNTER — Encounter (HOSPITAL_BASED_OUTPATIENT_CLINIC_OR_DEPARTMENT_OTHER): Payer: Self-pay | Admitting: Physical Therapy

## 2021-03-11 ENCOUNTER — Ambulatory Visit (HOSPITAL_BASED_OUTPATIENT_CLINIC_OR_DEPARTMENT_OTHER): Payer: BLUE CROSS/BLUE SHIELD | Admitting: Physical Therapy

## 2021-03-11 ENCOUNTER — Other Ambulatory Visit: Payer: Self-pay | Admitting: Nurse Practitioner

## 2021-03-11 DIAGNOSIS — M25511 Pain in right shoulder: Secondary | ICD-10-CM

## 2021-03-11 DIAGNOSIS — M6281 Muscle weakness (generalized): Secondary | ICD-10-CM

## 2021-03-11 DIAGNOSIS — R928 Other abnormal and inconclusive findings on diagnostic imaging of breast: Secondary | ICD-10-CM

## 2021-03-11 NOTE — Therapy (Signed)
OUTPATIENT PHYSICAL THERAPY TREATMENT NOTE   Patient Name: Shari Gonzales MRN: [id number] DOB:30-Oct-1960, 61 y.o., female Today's Date: 03/11/2021  PCP: Kathrynn Speed, NP REFERRING PROVIDER: Kathrynn Speed, NP   PT End of Session - 03/11/21 1056     Visit Number 3    Number of Visits 17    Date for PT Re-Evaluation 04/17/21    Authorization Type [employer]    PT Start Time 1100    PT Stop Time 1138    PT Time Calculation (min) 38 min    Activity Tolerance Patient tolerated treatment well    Behavior During Therapy WFL for tasks assessed/performed             Past Medical History:  Diagnosis Date   Hyperkalemia    Hyperlipidemia    Hypertension    Seizures (HCC)    Vitamin D deficiency    History reviewed. No pertinent surgical history. Patient Active Problem List   Diagnosis Date Noted   Dental caries 03/07/2018   HTN (hypertension) 07/12/2016   Hot flashes 07/12/2016   Concussion with loss of consciousness 05/14/2015    REFERRING DIAG: S43.004A (ICD-10-CM) - Dislocation of right shoulder joint, initial encounter  THERAPY DIAG:  Acute pain of right shoulder  Muscle weakness (generalized)  PERTINENT HISTORY: Seizures, vit D deficiency   PRECAUTIONS: none  SUBJECTIVE: still feels throbby in shoulder. Exercises ease the pain some. Denies popping/clicking.    PAIN:  Are you having pain? Yes NPRS scale: 10/10 Pain location: Rt UE  Pain description:  throbbing   Aggravating factors: constatnt pain Relieving factors: exercises feel good   OBJECTIVE:    DIAGNOSTIC FINDINGS:  Scheduled for MRI- going today   PATIENT SURVEYS:  FOTO 45   COGNITION:          Overall cognitive status: Within functional limits for tasks assessed                               SENSATION:          Reports some numbness and shooting pain   POSTURE: Rt shoulder elevation at rest   PALPATION: Gross TTP   UPPER EXTREMITY AROM/PROM:  PROM is full with stretchy end  feel with reports of pain throughout motion   A/PROM Right 02/19/2021 Right  03/11/21  Shoulder flexion 120  122  Shoulder extension      Shoulder abduction 80  144  Shoulder adduction      Shoulder internal rotation      Shoulder external rotation      Elbow flexion      Elbow extension      Wrist flexion      Wrist extension      Wrist ulnar deviation      Wrist radial deviation      Wrist pronation      Wrist supination      (Blank rows = not tested)   UPPER EXTREMITY MMT: all shoulder MMT painful limiting measurements   MMT Right 02/19/2021 Left 02/19/2021  Shoulder flexion      Shoulder extension      Shoulder abduction      Shoulder adduction      Shoulder internal rotation      Shoulder external rotation      Middle trapezius      Lower trapezius      Elbow flexion WFL    Elbow extension [facility]  Wrist flexion      Wrist extension      Wrist ulnar deviation      Wrist radial deviation      Wrist pronation      Wrist supination      Grip strength (lbs)      (Blank rows = not tested)                    TODAY'S TREATMENT:  2/22: MANUAL: trigger point release Rt upper trap, passive shoulder ROM, Ktape: 3xdeltoid & deltoid sling Pulleys flexion Red tband- ER & row single arm Supine circles flexion at 90 Supine chest press Supine flexion with wand 90-120  2/15: MANUAL: trigger point release around Rt shoulder and scapula; PROM Rt shoulder  Supine isometric scap retraction Standing isometrics all directions Seated shoulder rolls PT resisted scap retraction  EVAL: MANUAL- STM Rt upper trap AAROM flexion with dowel, within tolerable range     PATIENT EDUCATION: Education details: Teacher, music of condition, POC, HEP, exercise form/rationale   Person educated: Patient Education method: Explanation, Demonstration, Tactile cues, Verbal cues, and Handouts Education comprehension: verbalized understanding, returned demonstration, verbal cues required, tactile  cues required, and needs further education     HOME EXERCISE PROGRAM: [email or web address]   ASSESSMENT:   CLINICAL IMPRESSION:  Ktape for some added stability today and progressed exercises. Pt continues to report relief with exercises. Did have to RS MRI due to transportation. Abduction ROM increased significantly today.    Objective impairments include decreased activity tolerance, decreased ROM, decreased strength, increased muscle spasms, impaired flexibility, impaired sensation, impaired UE functional use, and pain. These impairments are limiting patient from cleaning, community activity, driving, meal prep, occupation, laundry, and shopping. Personal factors including 1 comorbidity: seizures of unknown origin or triggers  are also affecting patient's functional outcome. Patient will benefit from skilled PT to address above impairments and improve overall function.   REHAB POTENTIAL: Good   CLINICAL DECISION MAKING: Stable/uncomplicated   EVALUATION COMPLEXITY: Low     GOALS: Goals reviewed with patient? Yes   SHORT TERM GOALS:   STG Name Target Date Goal status  1 Will demo proper form with HEP as it has been established in the short term Baseline: will progress as appropriate 03/05/2021 Achieved      2/15  2 Decreased pain to average of 7/10 Baseline: cont to report 10/10 initially with improvements following session 03/12/2021 ongoing                                                 LONG TERM GOALS:    LTG Name Target Date Goal status  1 Pt will return to work with ability to complete job duties Baseline: unable to use arm to lift due to pain right now 04/17/21 INITIAL  2 Able to sleep without waking due to pain Baseline: unable at eval 04/17/21 INITIAL  3 FOTO to 60 function Baseline: 45 at eval 04/17/21 INITIAL  4 Pt will demo at least 90% strength via dynamometry testing vs Lt UE Baseline:not appropriate to test at eval due to pain levels 04/17/21 INITIAL                                PLAN: PT FREQUENCY: 1-2x/week   PT DURATION: 8 weeks  PLANNED INTERVENTIONS: Therapeutic exercises, Therapeutic activity, Neuro Muscular re-education, Patient/Family education, Joint mobilization, Aquatic Therapy, Dry Needling, Electrical stimulation, Spinal mobilization, Cryotherapy, Moist heat, Taping, Traction, Ionotophoresis 4mg /ml Dexamethasone, and Manual therapy   PLAN FOR NEXT SESSION: cont STM & periscap stabilization    Herschell Virani C. Barbara Keng PT, DPT 03/11/21 11:58 AM

## 2021-03-12 NOTE — Therapy (Incomplete)
OUTPATIENT PHYSICAL THERAPY TREATMENT NOTE   Patient Name: Shari Gonzales MRN: [id number] DOB:November 30, 1960, 61 y.o., female Today's Date: 03/12/2021  PCP: Kathrynn Speed, NP REFERRING PROVIDER: Kathrynn Speed, NP     Past Medical History:  Diagnosis Date   Hyperkalemia    Hyperlipidemia    Hypertension    Seizures (HCC)    Vitamin D deficiency    No past surgical history on file. Patient Active Problem List   Diagnosis Date Noted   Dental caries 03/07/2018   HTN (hypertension) 07/12/2016   Hot flashes 07/12/2016   Concussion with loss of consciousness 05/14/2015    REFERRING DIAG: S43.004A (ICD-10-CM) - Dislocation of right shoulder joint, initial encounter  THERAPY DIAG:  No diagnosis found.  PERTINENT HISTORY: Seizures, vit D deficiency   PRECAUTIONS: none  SUBJECTIVE: ***  PAIN:  Are you having pain? Yes NPRS scale: 10/10 Pain location: Rt UE  Pain description:  throbbing   Aggravating factors: constatnt pain Relieving factors: exercises feel good   OBJECTIVE:    DIAGNOSTIC FINDINGS:  Scheduled for MRI- going today   PATIENT SURVEYS:  FOTO 45   COGNITION:          Overall cognitive status: Within functional limits for tasks assessed                               SENSATION:          Reports some numbness and shooting pain   POSTURE: Rt shoulder elevation at rest   PALPATION: Gross TTP   UPPER EXTREMITY AROM/PROM:  PROM is full with stretchy end feel with reports of pain throughout motion   A/PROM Right 02/19/2021 Right  03/11/21  Shoulder flexion 120  122  Shoulder extension      Shoulder abduction 80  144  Shoulder adduction      Shoulder internal rotation      Shoulder external rotation      Elbow flexion      Elbow extension      Wrist flexion      Wrist extension      Wrist ulnar deviation      Wrist radial deviation      Wrist pronation      Wrist supination      (Blank rows = not tested)   UPPER EXTREMITY MMT: all  shoulder MMT painful limiting measurements   MMT Right 02/19/2021 Left 02/19/2021  Shoulder flexion      Shoulder extension      Shoulder abduction      Shoulder adduction      Shoulder internal rotation      Shoulder external rotation      Middle trapezius      Lower trapezius      Elbow flexion WFL    Elbow extension WFL    Wrist flexion      Wrist extension      Wrist ulnar deviation      Wrist radial deviation      Wrist pronation      Wrist supination      Grip strength (lbs)      (Blank rows = not tested)                    TODAY'S TREATMENT:  2/24: ***  2/22: MANUAL: trigger point release Rt upper trap, passive shoulder ROM, Ktape: 3xdeltoid & deltoid sling Pulleys flexion Red tband- ER &  row single arm Supine circles flexion at 90 Supine chest press Supine flexion with wand 90-120  2/15: MANUAL: trigger point release around Rt shoulder and scapula; PROM Rt shoulder  Supine isometric scap retraction Standing isometrics all directions Seated shoulder rolls PT resisted scap retraction  EVAL: MANUAL- STM Rt upper trap AAROM flexion with dowel, within tolerable range     PATIENT EDUCATION: Education details: Teacher, music of condition, POC, HEP, exercise form/rationale   Person educated: Patient Education method: Explanation, Demonstration, Tactile cues, Verbal cues, and Handouts Education comprehension: verbalized understanding, returned demonstration, verbal cues required, tactile cues required, and needs further education     HOME EXERCISE PROGRAM: [email or web address]   ASSESSMENT:   CLINICAL IMPRESSION:  *** Ktape for some added stability today and progressed exercises. Pt continues to report relief with exercises. Did have to RS MRI due to transportation. Abduction ROM increased significantly today.    Objective impairments include decreased activity tolerance, decreased ROM, decreased strength, increased muscle spasms, impaired flexibility, impaired sensation,  impaired UE functional use, and pain. These impairments are limiting patient from cleaning, community activity, driving, meal prep, occupation, laundry, and shopping. Personal factors including 1 comorbidity: seizures of unknown origin or triggers  are also affecting patient's functional outcome. Patient will benefit from skilled PT to address above impairments and improve overall function.   REHAB POTENTIAL: Good   CLINICAL DECISION MAKING: Stable/uncomplicated   EVALUATION COMPLEXITY: Low     GOALS: Goals reviewed with patient? Yes   SHORT TERM GOALS:   STG Name Target Date Goal status  1 Will demo proper form with HEP as it has been established in the short term Baseline: will progress as appropriate 03/05/2021 Achieved      2/15  2 Decreased pain to average of 7/10 Baseline: cont to report 10/10 initially with improvements following session 03/12/2021 ongoing                                                 LONG TERM GOALS:    LTG Name Target Date Goal status  1 Pt will return to work with ability to complete job duties Baseline: unable to use arm to lift due to pain right now 04/17/21 INITIAL  2 Able to sleep without waking due to pain Baseline: unable at eval 04/17/21 INITIAL  3 FOTO to 60 function Baseline: 45 at eval 04/17/21 INITIAL  4 Pt will demo at least 90% strength via dynamometry testing vs Lt UE Baseline:not appropriate to test at eval due to pain levels 04/17/21 INITIAL                               PLAN: PT FREQUENCY: 1-2x/week   PT DURATION: 8 weeks   PLANNED INTERVENTIONS: Therapeutic exercises, Therapeutic activity, Neuro Muscular re-education, Patient/Family education, Joint mobilization, Aquatic Therapy, Dry Needling, Electrical stimulation, Spinal mobilization, Cryotherapy, Moist heat, Taping, Traction, Ionotophoresis 4mg /ml Dexamethasone, and Manual therapy   PLAN FOR NEXT SESSION: cont STM & periscap stabilization    Fusako Tanabe C. Jaquawn Saffran PT,  DPT 03/12/21 8:28 PM

## 2021-03-13 ENCOUNTER — Ambulatory Visit (HOSPITAL_BASED_OUTPATIENT_CLINIC_OR_DEPARTMENT_OTHER): Payer: BLUE CROSS/BLUE SHIELD | Admitting: Physical Therapy

## 2021-03-13 ENCOUNTER — Other Ambulatory Visit: Payer: Self-pay

## 2021-03-13 ENCOUNTER — Encounter (HOSPITAL_BASED_OUTPATIENT_CLINIC_OR_DEPARTMENT_OTHER): Payer: Self-pay | Admitting: Physical Therapy

## 2021-03-13 DIAGNOSIS — M25511 Pain in right shoulder: Secondary | ICD-10-CM

## 2021-03-13 DIAGNOSIS — M6281 Muscle weakness (generalized): Secondary | ICD-10-CM

## 2021-03-13 NOTE — Therapy (Signed)
OUTPATIENT PHYSICAL THERAPY TREATMENT NOTE   Patient Name: Shari Gonzales MRN: [id number] DOB:06-23-1960, 61 y.o., female Today's Date: 03/13/2021  PCP: Kathrynn Speed, NP REFERRING PROVIDER: Huel Cote, MD   PT End of Session - 03/13/21 1101     Visit Number 4    Number of Visits 17    Date for PT Re-Evaluation 04/17/21    Authorization Type BCBS    PT Start Time 1100    PT Stop Time 1140    PT Time Calculation (min) 40 min    Activity Tolerance Patient tolerated treatment well    Behavior During Therapy WFL for tasks assessed/performed             Past Medical History:  Diagnosis Date   Hyperkalemia    Hyperlipidemia    Hypertension    Seizures (HCC)    Vitamin D deficiency    History reviewed. No pertinent surgical history. Patient Active Problem List   Diagnosis Date Noted   Dental caries 03/07/2018   HTN (hypertension) 07/12/2016   Hot flashes 07/12/2016   Concussion with loss of consciousness 05/14/2015    REFERRING DIAG: S43.004A (ICD-10-CM) - Dislocation of right shoulder joint, initial encounter  THERAPY DIAG:  Acute pain of right shoulder  Muscle weakness (generalized)  PERTINENT HISTORY: Seizures, vit D deficiency   PRECAUTIONS: none  SUBJECTIVE: still feels throbby in shoulder. Exercises ease the pain some. Denies popping/clicking.    PAIN:  Are you having pain? Yes NPRS scale: 10/10 Pain location: Rt UE  Pain description:  throbbing   Aggravating factors: constatnt pain Relieving factors: exercises feel good   OBJECTIVE:    DIAGNOSTIC FINDINGS:  Scheduled for MRI- going Monday   PATIENT SURVEYS:  FOTO 45   COGNITION:          Overall cognitive status: Within functional limits for tasks assessed                               SENSATION:          Reports some numbness and shooting pain   POSTURE: Rt shoulder elevation at rest   PALPATION: Gross TTP   UPPER EXTREMITY AROM/PROM:  PROM is full with stretchy end  feel, pain reported into arm rather than at shoulder; all AROM pain reported- rubs from shoulder to hand when showing where pain is located   A/PROM Right 02/19/2021 Right  03/11/21 Right  2/24  Shoulder flexion 120  122 145 supine, 148 seated  Shoulder extension       Shoulder abduction 80  144 155  Shoulder adduction     Opp shoulder  Shoulder internal rotation     Midline L3  Shoulder external rotation     C6  Elbow flexion       Elbow extension       Wrist flexion       Wrist extension       Wrist ulnar deviation       Wrist radial deviation       Wrist pronation       Wrist supination       (Blank rows = not tested)   UPPER EXTREMITY MMT: measuring grossly at 3+/5 limitations by pain   MMT Right 02/19/2021 Right  03/13/21  Shoulder flexion      Shoulder extension      Shoulder abduction      Shoulder adduction  Shoulder internal rotation      Shoulder external rotation      Middle trapezius      Lower trapezius      Elbow flexion [facility] WFL   Elbow extension [facility]   Wrist flexion      Wrist extension      Wrist ulnar deviation      Wrist radial deviation      Wrist pronation      Wrist supination      Grip strength (lbs)      (Blank rows = not tested)                    TODAY'S TREATMENT:  2/24: Unbilled time due to PT helping patient to set up MyChart account MANUAL: shoulder PROM, Lt upper trap STM Review of HEP & POC  2/22: MANUAL: trigger point release Rt upper trap, passive shoulder ROM, Ktape: 3xdeltoid & deltoid sling Pulleys flexion Red tband- ER & row single arm Supine circles flexion at 90 Supine chest press Supine flexion with wand 90-120  2/15: MANUAL: trigger point release around Rt shoulder and scapula; PROM Rt shoulder  Supine isometric scap retraction Standing isometrics all directions Seated shoulder rolls PT resisted scap retraction  EVAL: MANUAL- STM Rt upper trap AAROM flexion with dowel, within tolerable range      PATIENT EDUCATION: Education details: Teacher, music of condition, POC, HEP, exercise form/rationale   Person educated: Patient Education method: Explanation, Demonstration, Tactile cues, Verbal cues, and Handouts Education comprehension: verbalized understanding, returned demonstration, verbal cues required, tactile cues required, and needs further education     HOME EXERCISE PROGRAM: [email or web address]   ASSESSMENT:   CLINICAL IMPRESSION:  Pt has made significant objective improvements since beginning PT but still reports 10/10 pain in her shoulder. She reports she is trying to use it more often and just moves slowly. Due to the lack of subjective progress, we will hold on scheduling any further PT until her MRI and MD follow up next week. Encouraged her to contact me with any further questions   Objective impairments include decreased activity tolerance, decreased ROM, decreased strength, increased muscle spasms, impaired flexibility, impaired sensation, impaired UE functional use, and pain. These impairments are limiting patient from cleaning, community activity, driving, meal prep, occupation, laundry, and shopping. Personal factors including 1 comorbidity: seizures of unknown origin or triggers  are also affecting patient's functional outcome. Patient will benefit from skilled PT to address above impairments and improve overall function.   REHAB POTENTIAL: Good   CLINICAL DECISION MAKING: Stable/uncomplicated   EVALUATION COMPLEXITY: Low     GOALS: Goals reviewed with patient? Yes   SHORT TERM GOALS:   STG Name Target Date Goal status  1 Will demo proper form with HEP as it has been established in the short term Baseline: will progress as appropriate 03/05/2021 Achieved      2/15  2 Decreased pain to average of 7/10 Baseline: cont to report 10/10 initially with improvements following session 03/12/2021 ongoing                                                 LONG TERM GOALS:    LTG  Name Target Date Goal status  1 Pt will return to work with ability to complete job duties Baseline: unable to use arm to lift due  to pain right now 04/17/21 On hold  2 Able to sleep without waking due to pain Baseline: no change 04/17/21 On hold  3 FOTO to 60 function Baseline: 45 at eval 04/17/21 On hold  4 Pt will demo at least 90% strength via dynamometry testing vs Lt UE Baseline:not appropriate to test at eval due to pain levels 04/17/21 On hold                               PLAN: PT FREQUENCY: 1-2x/week   PT DURATION: 8 weeks   PLANNED INTERVENTIONS: Therapeutic exercises, Therapeutic activity, Neuro Muscular re-education, Patient/Family education, Joint mobilization, Aquatic Therapy, Dry Needling, Electrical stimulation, Spinal mobilization, Cryotherapy, Moist heat, Taping, Traction, Ionotophoresis 4mg /ml Dexamethasone, and Manual therapy   PLAN FOR NEXT SESSION: re-evaluate and set new POC    Ahmira Boisselle C. Kalysta Kneisley PT, DPT 03/13/21 9:02 PM

## 2021-03-16 ENCOUNTER — Ambulatory Visit
Admission: RE | Admit: 2021-03-16 | Discharge: 2021-03-16 | Disposition: A | Discharge status: Home / Self Care | Payer: BLUE CROSS/BLUE SHIELD | Source: Ambulatory Visit | Attending: Orthopaedic Surgery | Admitting: Orthopaedic Surgery

## 2021-03-16 DIAGNOSIS — M25511 Pain in right shoulder: Secondary | ICD-10-CM

## 2021-03-18 ENCOUNTER — Ambulatory Visit (INDEPENDENT_AMBULATORY_CARE_PROVIDER_SITE_OTHER): Payer: BLUE CROSS/BLUE SHIELD | Admitting: Orthopaedic Surgery

## 2021-03-18 ENCOUNTER — Ambulatory Visit (HOSPITAL_BASED_OUTPATIENT_CLINIC_OR_DEPARTMENT_OTHER): Payer: BLUE CROSS/BLUE SHIELD | Admitting: Orthopaedic Surgery

## 2021-03-18 ENCOUNTER — Other Ambulatory Visit: Payer: Self-pay

## 2021-03-18 DIAGNOSIS — M7501 Adhesive capsulitis of right shoulder: Secondary | ICD-10-CM | POA: Diagnosis not present

## 2021-03-18 DIAGNOSIS — M25511 Pain in right shoulder: Secondary | ICD-10-CM | POA: Diagnosis not present

## 2021-03-18 MED ORDER — TRIAMCINOLONE ACETONIDE 40 MG/ML IJ SUSP
80.0000 mg | INTRAMUSCULAR | Status: AC | PRN
  Administered 2021-03-18: 80 mg via INTRA_ARTICULAR

## 2021-03-18 MED ORDER — LIDOCAINE HCL 1 % IJ SOLN
4.0000 mL | INTRAMUSCULAR | Status: AC | PRN
  Administered 2021-03-18: 4 mL

## 2021-03-18 NOTE — Progress Notes (Signed)
? ?                            ? ? ?Chief Complaint: right shoulder pain, dislocation ?  ? ? ?History of Present Illness:  ? ?03/18/2021: Presents today for MRI follow-up of the right shoulder.  She has been working with physical therapy and is overall slowly improving although overhead activity has been somewhat painful still. ? ?Shari Gonzales is a 61 y.o. female dominant female presents after right shoulder dislocation that occurred on January 03, 2021.  She had to go to the emergency room to have this relocated.  This dislocated when she was pulling a heavy load of laundry at work.  Denies any previous issues of instability or dislocation.  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  At this time she is here for further evaluation ? ? ? ?Surgical History:   ?None ? ?PMH/PSH/Family History/Social History/Meds/Allergies:   ? ?Past Medical History:  ?Diagnosis Date  ?? Hyperkalemia   ?? Hyperlipidemia   ?? Hypertension   ?? Seizures (HCC)   ?? Vitamin D deficiency   ? ?No past surgical history on file. ?Social History  ? ?Socioeconomic History  ?? Marital status: Single  ?  Spouse name: Not on file  ?? Number of children: Not on file  ?? Years of education: Not on file  ?? Highest education level: Not on file  ?Occupational History  ?? Not on file  ?Tobacco Use  ?? Smoking status: Never  ?? Smokeless tobacco: Never  ?Vaping Use  ?? Vaping Use: Never used  ?Substance and Sexual Activity  ?? Alcohol use: No  ?  Alcohol/week: 0.0 standard drinks  ?? Drug use: Not Currently  ?? Sexual activity: Yes  ?  Birth control/protection: Post-menopausal  ?Other Topics Concern  ?? Not on file  ?Social History Narrative  ?? Not on file  ? ?Social Determinants of Health  ? ?Financial Resource Strain: Not on file  ?Food Insecurity: Not on file  ?Transportation Needs: Not on file  ?Physical Activity: Not on file  ?Stress: Not  on file  ?Social Connections: Not on file  ? ?Family History  ?Family history unknown: Yes  ? ?No Known Allergies ?Current Outpatient Medications  ?Medication Sig Dispense Refill  ?? diclofenac sodium (VOLTAREN) 1 % GEL Apply 2 g topically 4 (four) times daily as needed. 100 g 0  ?? HYDROcodone-acetaminophen (NORCO/VICODIN) 5-325 MG tablet Take 1 tablet by mouth every 6 (six) hours as needed for severe pain. 8 tablet 0  ?? meloxicam (MOBIC) 15 MG tablet TAKE 1 TABLET BY MOUTH DAILY FOR 14 DAYS 14 tablet 0  ?? methocarbamol (ROBAXIN) 500 MG tablet Take 1 tablet (500 mg total) by mouth every 8 (eight) hours as needed for muscle spasms. 20 tablet 0  ?? Vitamin D, Ergocalciferol, (DRISDOL) 1.25 MG (50000 UT) CAPS capsule Take 1 capsule (50,000 Units total) by mouth every 7 (seven) days. 5 capsule 3  ? ?No current facility-administered medications for this visit.  ? ?MR Shoulder Right Wo Contrast ? ?Result Date: 03/17/2021 ?CLINICAL DATA:  Shoulder trauma.  Rotator cuff tear suspected. EXAM: MRI OF THE RIGHT SHOULDER WITHOUT CONTRAST TECHNIQUE: Multiplanar, multisequence MR imaging of the shoulder was performed. No intravenous contrast was administered. COMPARISON:  Right shoulder radiographs 01/26/2021 FINDINGS: Rotator cuff: There  is a tiny linear midsubstance partial-thickness tear of the anterior supraspinatus tendon footprint measuring only 1-2 mm in transverse dimension, 1-2 mm in AP dimension, and extending up to 4 mm along the longitudinal length of the tendon (coronal series 7, image 10). Infraspinatus subscapularis and teres minor are intact. Muscles: No rotator cuff muscle atrophy, fatty infiltration, or edema. Biceps long head: The intra-articular long head of the biceps tendon is intact. Acromioclavicular Joint: There are moderate degenerative changes of the acromioclavicular joint including joint space narrowing, subchondral marrow edema, and peripheral osteophytosis. Type II acromion. Mild  subacromial/subdeltoid bursitis. Glenohumeral Joint: Mild-to-moderate thinning of the glenoid and humeral head cartilage. Labrum: Grossly intact, but evaluation is limited by lack of intraarticular fluid. Bones:  No acute fracture. Other: None. IMPRESSION:: IMPRESSION: 1. Tiny linear midsubstance tear of the anterior supraspinatus tendon footprint. No tendon retraction. 2. Moderate degenerative changes of the acromioclavicular joint. 3. Mild-to-moderate thinning of the glenoid and humeral head cartilage. Electronically Signed   By: Neita Garnet M.D.   On: 03/17/2021 14:58   ? ?Review of Systems:   ?A ROS was performed including pertinent positives and negatives as documented in the HPI. ? ?Physical Exam :   ?Constitutional: NAD and appears stated age ?Neurological: Alert and oriented ?Psych: Appropriate affect and cooperative ?There were no vitals taken for this visit.  ? ?Comprehensive Musculoskeletal Exam:   ? ?Musculoskeletal Exam    ?Inspection Right Left  ?Skin No atrophy or winging No atrophy or winging  ?Palpation    ?Tenderness Glenohumeral none  ?Range of Motion    ?Flexion (passive) 150 170  ?Flexion (active) 150 170  ?Abduction 140 170  ?ER at the side 30 70  ?Can reach behind back to L5 T12  ?Strength    ? 4/5 with pain 5 out of 5  ?Special Tests    ?Pseudoparalytic No No  ?Neurologic    ?Fires PIN, radial, median, ulnar, musculocutaneous, axillary, suprascapular, long thoracic, and spinal accessory innervated muscles. No abnormal sensibility  ?Vascular/Lymphatic    ?Radial Pulse 2+ 2+  ?Cervical Exam    ?Patient has symmetric cervical range of motion with negative Spurling's test.  ?Special Test:   ? ? ? ?Imaging:   ?Xray (3 views right shoulder): ?Concentrically reduced right shoulder ? ?MRI right shoulder: ?She has overall a very minor partial rotator cuff tear.  There appears to be some tearing of the anterior labrum.  Glenohumeral cartilage is preserved.  She has inferior capsular thickening  consistent with adhesive capsulitis ? ? ?I personally reviewed and interpreted the radiographs. ? ? ?Assessment:   ?61 year old female status post right shoulder dislocation which was subsequently reduced in the emergency room we did discuss that with a shoulder dislocation.  MRI today does show a anterior labral injury with a largely intact rotator cuff.  There is also evidence of capsulitis with a very thick inferior glenohumeral ligament and obliteration of the pouch.  As result I would like to perform an ultrasound-guided glenohumeral injection to hopefully help with his capsulitis.  I would like her to take it easy for 1 week following this.  We will plan to resume physical therapy after that to make consistent gains and hopefully return her to work within 1 month ? ?Plan :   ? ?-Right ultrasound-guided glenohumeral injection performed today after verbal consent obtained ?-Return to clinic in 2 weeks for follow-up ?-Work note provided ? ? ? ?Procedure Note ? ?Patient: Kelci Petrella             ?  Date of Birth: 1960-10-12           ?MRN: [id number]             ?Visit Date: 03/18/2021 ? ?Procedures: ?Visit Diagnoses: No diagnosis found. ? ?Large Joint Inj: R glenohumeral on 03/18/2021 10:56 AM ?Indications: pain ?Details: 22 G 1.5 in needle, ultrasound-guided anterior approach ? ?Arthrogram: No ? ?Medications: 4 mL lidocaine 1 %; 80 mg triamcinolone acetonide 40 MG/ML ?Outcome: tolerated well, no immediate complications ?Procedure, treatment alternatives, risks and benefits explained, specific risks discussed. Consent was given by the patient. Immediately prior to procedure a time out was called to verify the correct patient, procedure, equipment, support staff and site/side marked as required. Patient was prepped and draped in the usual sterile fashion.  ? ? ? ? ? ? ? ? ?I personally saw and evaluated the patient, and participated in the management and treatment plan. ? ?Huel Cote, MD ?Attending Physician,  Orthopedic Surgery ? ?This document was dictated using Conservation officer, historic buildings. A reasonable attempt at proof reading has been made to minimize errors. ?

## 2021-03-23 ENCOUNTER — Ambulatory Visit: Payer: Self-pay | Admitting: Nurse Practitioner

## 2021-03-23 ENCOUNTER — Other Ambulatory Visit: Payer: BLUE CROSS/BLUE SHIELD

## 2021-04-03 ENCOUNTER — Ambulatory Visit (HOSPITAL_BASED_OUTPATIENT_CLINIC_OR_DEPARTMENT_OTHER): Payer: BLUE CROSS/BLUE SHIELD | Admitting: Orthopaedic Surgery

## 2021-04-03 ENCOUNTER — Ambulatory Visit: Payer: BLUE CROSS/BLUE SHIELD | Attending: Nurse Practitioner

## 2021-04-03 ENCOUNTER — Other Ambulatory Visit: Payer: Self-pay

## 2021-04-03 DIAGNOSIS — M25511 Pain in right shoulder: Secondary | ICD-10-CM

## 2021-04-03 DIAGNOSIS — M6281 Muscle weakness (generalized): Secondary | ICD-10-CM | POA: Diagnosis present

## 2021-04-03 DIAGNOSIS — M7501 Adhesive capsulitis of right shoulder: Secondary | ICD-10-CM

## 2021-04-03 NOTE — Therapy (Signed)
?OUTPATIENT PHYSICAL THERAPY TREATMENT NOTE/RE-EVAL ? ? ?Patient Name: Shari Gonzales ?MRN: [id number] ?DOB:02/01/1960, 61 y.o., female ?Today's Date: 04/03/2021 ? ?PCP: Kathrynn SpeedPassmore, Tewana I, NP ?REFERRING PROVIDER: Kathrynn SpeedPassmore, Tewana I, NP ? ? PT End of Session - 04/03/21 1001   ? ? Visit Number 5   ? Number of Visits 17   ? Date for PT Re-Evaluation 04/17/21   ? Authorization Type BCBS   ? PT Start Time 1000   ? PT Stop Time 1040   ? PT Time Calculation (min) 40 min   ? Activity Tolerance Patient tolerated treatment well   ? [facility] tasks assessed/performed   ? ?  ?  ? ?  ? ? ?Past Medical History:  ?Diagnosis Date  ? Hyperkalemia   ? Hyperlipidemia   ? Hypertension   ? Seizures (HCC)   ? Vitamin D deficiency   ? ?History reviewed. No pertinent surgical history. ?Patient Active Problem List  ? Diagnosis Date Noted  ? Dental caries 03/07/2018  ? HTN (hypertension) 07/12/2016  ? Hot flashes 07/12/2016  ? Concussion with loss of consciousness 05/14/2015  ? ? ?REFERRING DIAG: S43.004A (ICD-10-CM) - Dislocation of right shoulder joint, initial encounter ? ?THERAPY DIAG: R shoulder adhesive capsulitis  ? ? ?PERTINENT HISTORY: Seizures, vit D deficiency  ? ?PRECAUTIONS: none ? ?SUBJECTIVE: Reports high levels of pain, 10/10 24/7, recently had MRI scan and needs to reschedule appoint with Dr. Steward DroneBokshan ? ?PAIN:  ?Are you having pain? Yes ?NPRS scale: 10/10 ?Pain location: Rt UE ? ?Pain description:  throbbing   ?Aggravating factors: constatnt pain ?Relieving factors: exercises feel good ? ? ?OBJECTIVE:  ?  ?DIAGNOSTIC FINDINGS: IMPRESSION:: ?IMPRESSION: ?1. Tiny linear midsubstance tear of the anterior supraspinatus ?tendon footprint. No tendon retraction. ?2. Moderate degenerative changes of the acromioclavicular joint. ?3. Mild-to-moderate thinning of the glenoid and humeral head ?cartilage. ?  ?  ?Electronically Signed ?  By: Neita Garnetonald  Viola M.D. ?  On: 03/17/2021 14:58 ?  ?PATIENT SURVEYS:  ?FOTO 45 ?   ?COGNITION: ?         Overall cognitive status: Within functional limits for tasks assessed ?                              ?SENSATION: ?         Reports some numbness and shooting pain ?  ?POSTURE: ?Rt shoulder elevation at rest ?  ?PALPATION: ?Gross TTP ?  ?UPPER EXTREMITY AROM/PROM:  PROM is full with stretchy end feel, pain reported into arm rather than at shoulder; all AROM pain reported- rubs from shoulder to hand when showing where pain is located ?  ?A/PROM Right ?02/19/2021 Right  ?03/11/21 Right  ?2/24 R AROM ?04/04/22  ?Shoulder flexion 120  122 145 supine, 148 seated 170d  ?Shoulder extension      WFL  ?Shoulder abduction 80  144 155 170d  ?Shoulder adduction     Opp shoulder   ?Shoulder internal rotation     Midline L3 T12  ?Shoulder external rotation     C6 C7  ?Elbow flexion        ?Elbow extension        ?Wrist flexion        ?Wrist extension        ?Wrist ulnar deviation        ?Wrist radial deviation        ?Wrist pronation        ?  Wrist supination        ?(Blank rows = not tested) ?  ?UPPER EXTREMITY MMT: measuring grossly at 3+/5 limitations by pain ?  ?MMT L ?02/19/2021 Right  ?03/13/21  ?Shoulder flexion    3+  ?Shoulder extension   3+   ?Shoulder abduction    3+  ?Shoulder adduction      ?Shoulder internal rotation    3+  ?Shoulder external rotation    3+  ?Middle trapezius      ?Lower trapezius      ?Elbow flexion [facility] WFL   ?Elbow extension [facility] WFL   ?Wrist flexion    WFL   ?Wrist extension    WFL   ?Wrist ulnar deviation      ?Wrist radial deviation      ?Wrist pronation      ?Wrist supination      ?Grip strength (lbs)  31#  30#  ?(Blank rows = not tested) ?  ?  ?  ?  ?           ?TODAY'S TREATMENT:  ?[facility] Adult PT Treatment:                                                DATE: 04/03/21 ?Therapeutic Exercise: ?Supine press/protract 10x ?Supine flexion 10x ?SL ER 10x ?SL abduction 10x ?Prone ext 10x ?Prone flexion 10x ?Prone scaption 10x ?Prone hor abd ER 10x ?Manual: ? PROM, MMT, skilled palpation  of TPs and posterior shoulder musculature  ? ?2/24: ?Unbilled time due to PT helping patient to set up MyChart account ?MANUAL: shoulder PROM, Lt upper trap STM ?Review of HEP & POC ? ?2/22: ?MANUAL: trigger point release Rt upper trap, passive shoulder ROM, Ktape: 3xdeltoid & deltoid sling ?Pulleys flexion ?Red tband- ER & row single arm ?Supine circles flexion at 90 ?Supine chest press ?Supine flexion with wand 90-120 ? ?2/15: ?MANUAL: trigger point release around Rt shoulder and scapula; PROM Rt shoulder ? ?Supine isometric scap retraction ?Standing isometrics all directions ?Seated shoulder rolls ?PT resisted scap retraction ? ?EVAL: ?MANUAL- STM Rt upper trap ?AAROM flexion with dowel, within tolerable range ?  ?  ?PATIENT EDUCATION: ?Education details: Anatomy of condition, POC, HEP, exercise form/rationale ?  ?Person educated: Patient ?Education method: Explanation, Demonstration, Tactile cues, Verbal cues, and Handouts ?Education comprehension: verbalized understanding, returned demonstration, verbal cues required, tactile cues required, and needs further education ?  ?  ?HOME EXERCISE PROGRAM: ?Access Code: [email or web address] ?URL: [email or web address] ?Date: 04/03/2021 ?Prepared by: Gustavus Bryant ? ?Exercises ?Seated Scapular Retraction - 7 x weekly ?Seated Upper Trapezius Stretch - 2 x daily - 7 x weekly - 1 sets - 30s hold ?Standing Shoulder Scaption - 2 x daily - 7 x weekly - 3 sets - 10 reps - 30s hold ?Seated Shoulder Press Ups Off Table - 2 x daily - 7 x weekly - 1 sets - 10 reps ? ?  ?ASSESSMENT: ?  ?CLINICAL IMPRESSION: patient transfers from another clinic for continued treatment of R shoulder, pain is still rated at 10/10 but intensity does not correlate with observed mobility IE. no reluctance to remove coat, reach or weight bear on R UE.  A/PROM in shoulder WFL, strength rated at 3+/5 throughout, resisted testing deferred this date due to pain and guarding.  Rehab goals and POC updated  based on clinical presentation and recent  MRI findings ? ?  ?  ?Objective impairments include decreased activity tolerance, decreased ROM, decreased strength, increased muscle spasms, impaired flexibility, impaired sensation, impaired UE functional use, and pain. These impairments are limiting patient from cleaning, community activity, driving, meal prep, occupation, laundry, and shopping. Personal factors including 1 comorbidity: seizures of unknown origin or triggers  are also affecting patient's functional outcome. Patient will benefit from skilled PT to address above impairments and improve overall function. ?  ?REHAB POTENTIAL: Good ?  ?CLINICAL DECISION MAKING: Stable/uncomplicated ?  ?EVALUATION COMPLEXITY: Low ?  ?  ?GOALS: ?Goals reviewed with patient? Yes ?  ?SHORT TERM GOALS: ?  ?STG Name Target Date Goal status  ?1 Will demo proper form with HEP as it has been established in the short term ?Baseline: will progress as appropriate 04/03/2021 ongoing  ?2 Decreased pain to average of 7/10 ?Baseline: cont to report 10/10 initially with improvements following session 03/12/2021 ongoing  ?LONG TERM GOALS:  ?  ?LTG Name Target Date Goal status  ?1 Pt will return to work with ability to complete job duties ?Baseline: unable to use arm to lift due to pain right now 05/21/21 ongoing  ?2 Able to sleep without waking due to pain ?Baseline: no change 05/21/21 ongoing  ?3 FOTO to 60 function ?Baseline: 45 at eval 05/21/21 ongoing  ?4 Pt will demo at least 4/5 strength throughout R shoulder ?Baseline: 04/03/21 Globally 3+/5 strength 05/21/21 ongoing  ?PLAN: ?PT FREQUENCY: 1x/week ?  ?PT DURATION: 6 weeks ?  ?PLANNED INTERVENTIONS: Therapeutic exercises, Therapeutic activity, Neuro Muscular re-education, Patient/Family education, Joint mobilization, Aquatic Therapy, Dry Needling, Electrical stimulation, Spinal mobilization, Cryotherapy, Moist heat, Taping, Traction, Ionotophoresis 4mg /ml Dexamethasone, and Manual therapy ?  ?PLAN  FOR NEXT SESSION: Assess HEP performance and update as needed, R trapezius inhibition strategies, posterior shoulder strengthening and functional tasks ?  ? ? PT  ?04/03/21 10:02 AM ? ? ? ?  ? ?

## 2021-04-06 ENCOUNTER — Ambulatory Visit (INDEPENDENT_AMBULATORY_CARE_PROVIDER_SITE_OTHER): Payer: BLUE CROSS/BLUE SHIELD | Admitting: Orthopaedic Surgery

## 2021-04-06 ENCOUNTER — Other Ambulatory Visit: Payer: Self-pay

## 2021-04-06 DIAGNOSIS — S43004A Unspecified dislocation of right shoulder joint, initial encounter: Secondary | ICD-10-CM | POA: Diagnosis not present

## 2021-04-06 NOTE — Progress Notes (Signed)
? ?                            ? ? ?Chief Complaint: right shoulder pain, dislocation ?  ? ? ?History of Present Illness:  ? ?04/06/2021: Presents today for follow-up of the right shoulder.  She has been doing physical therapy once weekly and is overall making significant improvements.  She did state that she did not get significant relief from her steroid injection.  She continues to be out of work at her job working in [employer]. ? ?Shari Gonzales is a 61 y.o. female dominant female presents after right shoulder dislocation that occurred on January 03, 2021.  She had to go to the emergency room to have this relocated.  This dislocated when she was pulling a heavy load of laundry at work.  Denies any previous issues of instability or dislocation.  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  At this time she is here for further evaluation ? ? ? ?Surgical History:   ?None ? ?PMH/PSH/Family History/Social History/Meds/Allergies:   ? ?Past Medical History:  ?Diagnosis Date  ? Hyperkalemia   ? Hyperlipidemia   ? Hypertension   ? Seizures (HCC)   ? Vitamin D deficiency   ? ?No past surgical history on file. ?Social History  ? ?Socioeconomic History  ? Marital status: Single  ?  Spouse name: Not on file  ? Number of children: Not on file  ? Years of education: Not on file  ? Highest education level: Not on file  ?Occupational History  ? Not on file  ?Tobacco Use  ? Smoking status: Never  ? Smokeless tobacco: Never  ?Vaping Use  ? Vaping Use: Never used  ?Substance and Sexual Activity  ? Alcohol use: No  ?  Alcohol/week: 0.0 standard drinks  ? Drug use: Not Currently  ? Sexual activity: Yes  ?  Birth control/protection: Post-menopausal  ?Other Topics Concern  ? Not on file  ?Social History Narrative  ? Not on file  ? ?Social Determinants of Health  ? ?Financial Resource Strain: Not on file  ?Food Insecurity:  Not on file  ?Transportation Needs: Not on file  ?Physical Activity: Not on file  ?Stress: Not on file  ?Social Connections: Not on file  ? ?Family History  ?Family history unknown: Yes  ? ?No Known Allergies ?Current Outpatient Medications  ?Medication Sig Dispense Refill  ? diclofenac sodium (VOLTAREN) 1 % GEL Apply 2 g topically 4 (four) times daily as needed. 100 g 0  ? HYDROcodone-acetaminophen (NORCO/VICODIN) 5-325 MG tablet Take 1 tablet by mouth every 6 (six) hours as needed for severe pain. 8 tablet 0  ? meloxicam (MOBIC) 15 MG tablet TAKE 1 TABLET BY MOUTH DAILY FOR 14 DAYS 14 tablet 0  ? methocarbamol (ROBAXIN) 500 MG tablet Take 1 tablet (500 mg total) by mouth every 8 (eight) hours as needed for muscle spasms. 20 tablet 0  ? Vitamin D, Ergocalciferol, (DRISDOL) 1.25 MG (50000 UT) CAPS capsule Take 1 capsule (50,000 Units total) by mouth every 7 (seven) days. 5 capsule 3  ? ?No current facility-administered medications for this visit.  ? ?No results found. ? ?Review of Systems:   ?A ROS was performed including pertinent positives and negatives as documented in the HPI. ? ?Physical Exam :   ?  Constitutional: NAD and appears stated age ?Neurological: Alert and oriented ?Psych: Appropriate affect and cooperative ?There were no vitals taken for this visit.  ? ?Comprehensive Musculoskeletal Exam:   ? ?Musculoskeletal Exam    ?Inspection Right Left  ?Skin No atrophy or winging No atrophy or winging  ?Palpation    ?Tenderness Lateral deltoid none  ?Range of Motion    ?Flexion (passive) 170 170  ?Flexion (active) 170 170  ?Abduction 170 170  ?ER at the side 65 70  ?Can reach behind back to T12 T12  ?Strength    ? 4/5 with pain 5 out of 5  ?Special Tests    ?Pseudoparalytic No No  ?Neurologic    ?Fires PIN, radial, median, ulnar, musculocutaneous, axillary, suprascapular, long thoracic, and spinal accessory innervated muscles. No abnormal sensibility  ?Vascular/Lymphatic    ?Radial Pulse 2+ 2+  ?Cervical Exam     ?Patient has symmetric cervical range of motion with negative Spurling's test.  ?Special Test:   ? ? ? ?Imaging:   ?Xray (3 views right shoulder): ?Concentrically reduced right shoulder ? ?MRI right shoulder: ?She has overall a very minor partial rotator cuff tear.  There appears to be some tearing of the anterior labrum.  Glenohumeral cartilage is preserved.  She has inferior capsular thickening consistent with adhesive capsulitis ? ? ?I personally reviewed and interpreted the radiographs. ? ? ?Assessment:   ?61 year old female status post right shoulder dislocation which was subsequently reduced in the emergency room we did discuss that with a shoulder dislocation.  Overall she continues to make significant improvement in terms of shoulder range of motion.  She is now nearly equal to the contralateral side.  At this time I would like her to work on strengthening for an additional month.  At that time I will plan to recheck her and we will consider return to work.  Out of work note provided for an additional month. ? ?Plan :   ? ?-Return to clinic 1 month for reassessment and possible return to work ? ? ? ? ?I personally saw and evaluated the patient, and participated in the management and treatment plan. ? ?Huel Cote, MD ?Attending Physician, Orthopedic Surgery ? ?This document was dictated using Conservation officer, historic buildings. A reasonable attempt at proof reading has been made to minimize errors. ?

## 2021-04-10 ENCOUNTER — Ambulatory Visit: Payer: BLUE CROSS/BLUE SHIELD

## 2021-04-10 ENCOUNTER — Other Ambulatory Visit: Payer: Self-pay

## 2021-04-10 DIAGNOSIS — M7501 Adhesive capsulitis of right shoulder: Secondary | ICD-10-CM

## 2021-04-10 DIAGNOSIS — M25511 Pain in right shoulder: Secondary | ICD-10-CM | POA: Diagnosis not present

## 2021-04-10 DIAGNOSIS — M6281 Muscle weakness (generalized): Secondary | ICD-10-CM

## 2021-04-10 NOTE — Therapy (Signed)
?OUTPATIENT PHYSICAL THERAPY TREATMENT NOTE/RE-EVAL ? ? ?Patient Name: Shari Gonzales ?MRN: [id number] ?DOB:09/12/1960, 61 y.o., female ?Today's Date: 04/10/2021 ? ?PCP: Kathrynn SpeedPassmore, Tewana I, NP ?REFERRING PROVIDER: Kathrynn SpeedPassmore, Tewana I, NP ? ? PT End of Session - 04/10/21 1051   ? ? Visit Number 6   ? Number of Visits 17   ? Date for PT Re-Evaluation 04/17/21   ? Authorization Type BCBS   ? PT Start Time 1050   ? PT Stop Time 1130   ? PT Time Calculation (min) 40 min   ? Activity Tolerance Patient tolerated treatment well   ? [facility] tasks assessed/performed   ? ?  ?  ? ?  ? ? ?Past Medical History:  ?Diagnosis Date  ? Hyperkalemia   ? Hyperlipidemia   ? Hypertension   ? Seizures (HCC)   ? Vitamin D deficiency   ? ?History reviewed. No pertinent surgical history. ?Patient Active Problem List  ? Diagnosis Date Noted  ? Dental caries 03/07/2018  ? HTN (hypertension) 07/12/2016  ? Hot flashes 07/12/2016  ? Concussion with loss of consciousness 05/14/2015  ? ? ?REFERRING DIAG: S43.004A (ICD-10-CM) - Dislocation of right shoulder joint, initial encounter ? ?THERAPY DIAG: R shoulder adhesive capsulitis  ? ? ?PERTINENT HISTORY: Seizures, vit D deficiency  ? ?PRECAUTIONS: none ? ?SUBJECTIVE: pain and function unchanged, pain 10/10, feels shoulder may be swollen citing deltoid insertion site as swollen region ? ?PAIN:  ?Are you having pain? Yes ?NPRS scale: 10/10 ?Pain location: Rt UE ? ?Pain description:  throbbing   ?Aggravating factors: constatnt pain ?Relieving factors: exercises feel good ? ? ?OBJECTIVE:  ?  ?DIAGNOSTIC FINDINGS: IMPRESSION:: ?IMPRESSION: ?1. Tiny linear midsubstance tear of the anterior supraspinatus ?tendon footprint. No tendon retraction. ?2. Moderate degenerative changes of the acromioclavicular joint. ?3. Mild-to-moderate thinning of the glenoid and humeral head ?cartilage. ?  ?  ?Electronically Signed ?  By: Neita Garnetonald  Viola M.D. ?  On: 03/17/2021 14:58 ?  ?PATIENT SURVEYS:  ?FOTO  45 ?  ?COGNITION: ?         Overall cognitive status: Within functional limits for tasks assessed ?                              ?SENSATION: ?         Reports some numbness and shooting pain ?  ?POSTURE: ?Rt shoulder elevation at rest ?  ?PALPATION: ?Gross TTP ?  ?UPPER EXTREMITY AROM/PROM:  PROM is full with stretchy end feel, pain reported into arm rather than at shoulder; all AROM pain reported- rubs from shoulder to hand when showing where pain is located ?  ?A/PROM Right ?02/19/2021 Right  ?03/11/21 Right  ?2/24 R AROM ?04/04/22  ?Shoulder flexion 120  122 145 supine, 148 seated 170d  ?Shoulder extension      WFL  ?Shoulder abduction 80  144 155 170d  ?Shoulder adduction     Opp shoulder   ?Shoulder internal rotation     Midline L3 T12  ?Shoulder external rotation     C6 C7  ?Elbow flexion        ?Elbow extension        ?Wrist flexion        ?Wrist extension        ?Wrist ulnar deviation        ?Wrist radial deviation        ?Wrist pronation        ?  Wrist supination        ?(Blank rows = not tested) ?  ?UPPER EXTREMITY MMT: measuring grossly at 3+/5 limitations by pain ?  ?MMT L ?02/19/2021 Right  ?03/13/21  ?Shoulder flexion    3+  ?Shoulder extension   3+   ?Shoulder abduction    3+  ?Shoulder adduction      ?Shoulder internal rotation    3+  ?Shoulder external rotation    3+  ?Middle trapezius      ?Lower trapezius      ?Elbow flexion [facility] WFL   ?Elbow extension [facility] WFL   ?Wrist flexion    WFL   ?Wrist extension    WFL   ?Wrist ulnar deviation      ?Wrist radial deviation      ?Wrist pronation      ?Wrist supination      ?Grip strength (lbs)  31#  30#  ?(Blank rows = not tested) ?  ?  ?  ?  ?           ?TODAY'S TREATMENT:  ?[facility] Adult PT Treatment:                                                DATE: 04/10/21 ?Therapeutic Exercise: ?UBE 3/3 min, (unable to maintain speed to turn machine on) ?Supine press/protract 15x (all exercises performed holding non-weighted ball) ?Supine flexion 15x ?SL ER 15x ?SL abduction  15x ?Prone ext 15x ?Prone flexion 15x ?Prone scaption 15x ?Prone hor abd ER 15x ?Supine hor abduction YTB 15x ?Supine flexion 15/15 YTB ?Wall pushups 15x ?Ball roll flexion on wall 15x ?Seated scaption 15x ?Manual Therapy: ?D1 F/E 15x light manual resistance ? ?[facility] Adult PT Treatment:                                                DATE: 04/03/21 ?Therapeutic Exercise: ?Supine press/protract 10x ?Supine flexion 10x ?SL ER 10x ?SL abduction 10x ?Prone ext 10x ?Prone flexion 10x ?Prone scaption 10x ?Prone hor abd ER 10x ?Manual: ? PROM, MMT, skilled palpation of TPs and posterior shoulder musculature  ? ?2/24: ?Unbilled time due to PT helping patient to set up MyChart account ?MANUAL: shoulder PROM, Lt upper trap STM ?Review of HEP & POC ? ?2/22: ?MANUAL: trigger point release Rt upper trap, passive shoulder ROM, Ktape: 3xdeltoid & deltoid sling ?Pulleys flexion ?Red tband- ER & row single arm ?Supine circles flexion at 90 ?Supine chest press ?Supine flexion with wand 90-120 ? ?2/15: ?MANUAL: trigger point release around Rt shoulder and scapula; PROM Rt shoulder ? ?Supine isometric scap retraction ?Standing isometrics all directions ?Seated shoulder rolls ?PT resisted scap retraction ? ?EVAL: ?MANUAL- STM Rt upper trap ?AAROM flexion with dowel, within tolerable range ?  ?  ?PATIENT EDUCATION: ?Education details: Anatomy of condition, POC, HEP, exercise form/rationale ?  ?Person educated: Patient ?Education method: Explanation, Demonstration, Tactile cues, Verbal cues, and Handouts ?Education comprehension: verbalized understanding, returned demonstration, verbal cues required, tactile cues required, and needs further education ?  ?  ?HOME EXERCISE PROGRAM: ?Access Code: [email or web address] ?URL: [email or web address] ?Date: 04/03/2021 ?Prepared by: Gustavus Bryant ? ?Exercises ?Seated Scapular Retraction - 7 x weekly ?Seated Upper Trapezius Stretch - 2 x daily -  7 x weekly - 1 sets - 30s hold ?Standing Shoulder  Scaption - 2 x daily - 7 x weekly - 3 sets - 10 reps - 30s hold ?Seated Shoulder Press Ups Off Table - 2 x daily - 7 x weekly - 1 sets - 10 reps ? ?  ?ASSESSMENT: ?  ?CLINICAL IMPRESSION: continued 10/10 pain despite full mobility and no observed grimacing when performing exercises and tasks such as donning a coat and pushing off of armrests.  Advanced reps and resistance on shoulder exercise w/o difficulty, scapular elevation noted due to overactive R upper trap ? ?  ?  ?Objective impairments include decreased activity tolerance, decreased ROM, decreased strength, increased muscle spasms, impaired flexibility, impaired sensation, impaired UE functional use, and pain. These impairments are limiting patient from cleaning, community activity, driving, meal prep, occupation, laundry, and shopping. Personal factors including 1 comorbidity: seizures of unknown origin or triggers  are also affecting patient's functional outcome. Patient will benefit from skilled PT to address above impairments and improve overall function. ?  ?REHAB POTENTIAL: Good ?  ?CLINICAL DECISION MAKING: Stable/uncomplicated ?  ?EVALUATION COMPLEXITY: Low ?  ?  ?GOALS: ?Goals reviewed with patient? Yes ?  ?SHORT TERM GOALS: ?  ?STG Name Target Date Goal status  ?1 Will demo proper form with HEP as it has been established in the short term ?Baseline: will progress as appropriate 04/03/2021 ongoing  ?2 Decreased pain to average of 7/10 ?Baseline: cont to report 10/10 initially with improvements following session 03/12/2021 ongoing  ?LONG TERM GOALS:  ?  ?LTG Name Target Date Goal status  ?1 Pt will return to work with ability to complete job duties ?Baseline: unable to use arm to lift due to pain right now 05/21/21 ongoing  ?2 Able to sleep without waking due to pain ?Baseline: no change 05/21/21 ongoing  ?3 FOTO to 60 function ?Baseline: 45 at eval 05/21/21 ongoing  ?4 Pt will demo at least 4/5 strength throughout R shoulder ?Baseline: 04/03/21 Globally 3+/5  strength 05/21/21 ongoing  ?PLAN: ?PT FREQUENCY: 1x/week ?  ?PT DURATION: 6 weeks ?  ?PLANNED INTERVENTIONS: Therapeutic exercises, Therapeutic activity, Neuro Muscular re-education, Patient/Family education,

## 2021-04-13 ENCOUNTER — Other Ambulatory Visit: Payer: Self-pay

## 2021-04-13 ENCOUNTER — Ambulatory Visit: Payer: BLUE CROSS/BLUE SHIELD

## 2021-04-13 DIAGNOSIS — M6281 Muscle weakness (generalized): Secondary | ICD-10-CM

## 2021-04-13 DIAGNOSIS — M7501 Adhesive capsulitis of right shoulder: Secondary | ICD-10-CM

## 2021-04-13 DIAGNOSIS — M25511 Pain in right shoulder: Secondary | ICD-10-CM | POA: Diagnosis not present

## 2021-04-13 NOTE — Therapy (Signed)
?OUTPATIENT PHYSICAL THERAPY TREATMENT NOTE/RE-EVAL ? ? ?Patient Name: Shari Gonzales ?MRN: [id number] ?DOB:09/25/1960, [age] y.o., female ?Today's Date: 04/13/2021 ? ?PCP: Kathrynn SpeedPassmore, Tewana I, NP ?REFERRING PROVIDER: Kathrynn SpeedPassmore, Tewana I, NP ? ? PT End of Session - 04/13/21 0835   ? ? Visit Number 7   ? Number of Visits 17   ? Date for PT Re-Evaluation 04/17/21   ? Authorization Type BCBS   ? PT Start Time [phone]   ? PT Stop Time 0914   ? PT Time Calculation (min) 39 min   ? Activity Tolerance Patient tolerated treatment well   ? Behavior [facility] tasks assessed/performed   ? ?  ?  ? ?  ? ? ? ?Past Medical History:  ?Diagnosis Date  ? Hyperkalemia   ? Hyperlipidemia   ? Hypertension   ? Seizures (HCC)   ? Vitamin D deficiency   ? ?No past surgical history on file. ?Patient Active Problem List  ? Diagnosis Date Noted  ? Dental caries 03/07/2018  ? HTN (hypertension) 07/12/2016  ? Hot flashes 07/12/2016  ? Concussion with loss of consciousness 05/14/2015  ? ? ?REFERRING DIAG: S43.004A (ICD-10-CM) - Dislocation of right shoulder joint, initial encounter ? ?THERAPY DIAG: R shoulder adhesive capsulitis  ? ? ?PERTINENT HISTORY: Seizures, vit D deficiency  ? ?PRECAUTIONS: none ? ?SUBJECTIVE:  ?Pt presents to PT with continued reports of R shoulder pain. Has continued HEP compliance with no adverse effect. Pt is ready to begin PT at this time.  ? ?PAIN:  ?Are you having pain? Yes ?NPRS scale: 8/10 ?Pain location: Rt UE ? ?Pain description:  throbbing   ?Aggravating factors: constatnt pain ?Relieving factors: exercises feel good ? ? ?OBJECTIVE:  ?  ?PATIENT SURVEYS:  ?FOTO 45 ?  ?COGNITION: ?         Overall cognitive status: Within functional limits for tasks assessed ?                              ?SENSATION: ?         Reports some numbness and shooting pain ?  ?POSTURE: ?Rt shoulder elevation at rest ?  ?PALPATION: ?Gross TTP ?  ?UPPER EXTREMITY AROM/PROM:  PROM is full with stretchy end feel, pain reported into arm  rather than at shoulder; all AROM pain reported- rubs from shoulder to hand when showing where pain is located ?  ?A/PROM Right ?02/19/2021 Right  ?03/11/21 Right  ?2/24 R AROM ?04/04/22  ?Shoulder flexion 120  122 145 supine, 148 seated 170d  ?Shoulder extension      WFL  ?Shoulder abduction 80  144 155 170d  ?Shoulder adduction     Opp shoulder   ?Shoulder internal rotation     Midline L3 T12  ?Shoulder external rotation     C6 C7  ?Elbow flexion        ?Elbow extension        ?Wrist flexion        ?Wrist extension        ?Wrist ulnar deviation        ?Wrist radial deviation        ?Wrist pronation        ?Wrist supination        ?(Blank rows = not tested) ?  ?UPPER EXTREMITY MMT: measuring grossly at 3+/5 limitations by pain ?  ?MMT L ?02/19/2021 Right  ?03/13/21  ?Shoulder flexion    3+  ?  Shoulder extension   3+   ?Shoulder abduction    3+  ?Shoulder adduction      ?Shoulder internal rotation    3+  ?Shoulder external rotation    3+  ?Middle trapezius      ?Lower trapezius      ?Elbow flexion [facility] WFL   ?Elbow extension [facility] WFL   ?Wrist flexion    WFL   ?Wrist extension    WFL   ?Wrist ulnar deviation      ?Wrist radial deviation      ?Wrist pronation      ?Wrist supination      ?Grip strength (lbs)  31#  30#  ?(Blank rows = not tested) ?  ?  ?  ?  ?           ?TODAY'S TREATMENT:  ?[facility] Adult PT Treatment:                                                DATE: 04/13/21 ?Therapeutic Exercise: ?UBE x 2 min lvl 1.0 while taking subjective ?Supine cane chest press x 15 ?Supine cane flexion 15x ?Supine horizontal abd 2x10 YTB ?SL ER 2x10 R ?SL abduction 2x10 R ?Prone ext 2x10 R ?Prone scaption 2x10 R ?Standing row 3x10 GTB ?Wall pushups 15x ?Seated bilateral ER 2x10 YTB ?Seated scaption 2x10 R ? ?[facility] Adult PT Treatment:                                                DATE: 04/10/21 ?Therapeutic Exercise: ?UBE 3/3 min, (unable to maintain speed to turn machine on) ?Supine press/protract 15x (all exercises performed holding  non-weighted ball) ?Supine flexion 15x ?SL ER 15x ?SL abduction 15x ?Prone ext 15x ?Prone flexion 15x ?Prone scaption 15x ?Prone hor abd ER 15x ?Supine hor abduction YTB 15x ?Supine flexion 15/15 YTB ?Wall pushups 15x ?Ball roll flexion on wall 15x ?Seated scaption 15x ?Manual Therapy: ?D1 F/E 15x light manual resistance ? ?[facility] Adult PT Treatment:                                                DATE: 04/03/21 ?Therapeutic Exercise: ?Supine press/protract 10x ?Supine flexion 10x ?SL ER 10x ?SL abduction 10x ?Prone ext 10x ?Prone flexion 10x ?Prone scaption 10x ?Prone hor abd ER 10x ?Manual: ?PROM, MMT, skilled palpation of TPs and posterior shoulder musculature  ? ?  ?PATIENT EDUCATION: ?Education details: Anatomy of condition, POC, HEP, exercise form/rationale ?  ?Person educated: Patient ?Education method: Explanation, Demonstration, Tactile cues, Verbal cues, and Handouts ?Education comprehension: verbalized understanding, returned demonstration, verbal cues required, tactile cues required, and needs further education ?  ?  ?HOME EXERCISE PROGRAM: ?Access Code: [email or web address] ?URL: [email or web address] ?Date: 04/03/2021 ?Prepared by: Gustavus Bryant ? ?Exercises ?Seated Scapular Retraction - 7 x weekly ?Seated Upper Trapezius Stretch - 2 x daily - 7 x weekly - 1 sets - 30s hold ?Standing Shoulder Scaption - 2 x daily - 7 x weekly - 3 sets - 10 reps - 30s hold ?Seated Shoulder Press Ups Off Table - 2 x daily - 7  x weekly - 1 sets - 10 reps ? ?  ?ASSESSMENT: ?  ?CLINICAL IMPRESSION:  ?Pt was able to complete all prescribed exercises with no adverse effect or increase in pain. Therapy today focused on continued strengthening of RTC and periscapular musculature in order to improve dynamic stabilization and decrease pain. She continues to benefit from skilled PT services and will continue to be seen and progressed as tolerated per POC.  ?  ?  ?Objective impairments:  ?decreased activity tolerance, decreased ROM,  decreased strength, increased muscle spasms, impaired flexibility, impaired sensation, impaired UE functional use, and pain. These impairments are limiting patient from cleaning, community activity, driving, meal prep, occupation, laundry, and shopping. Personal factors including 1 comorbidity: seizures of unknown origin or triggers  are also affecting patient's functional outcome. Patient will benefit from skilled PT to address above impairments and improve overall function. ?  ? ?  ?  ?GOALS: ?Goals reviewed with patient? Yes ?  ?SHORT TERM GOALS: ?  ?STG Name Target Date Goal status  ?1 Will demo proper form with HEP as it has been established in the short term ?Baseline: will progress as appropriate 04/03/2021 ongoing  ?2 Decreased pain to average of 7/10 ?Baseline: cont to report 10/10 initially with improvements following session 03/12/2021 ongoing  ?LONG TERM GOALS:  ?  ?LTG Name Target Date Goal status  ?1 Pt will return to work with ability to complete job duties ?Baseline: unable to use arm to lift due to pain right now 05/21/21 ongoing  ?2 Able to sleep without waking due to pain ?Baseline: no change 05/21/21 ongoing  ?3 FOTO to 60 function ?Baseline: 45 at eval 05/21/21 ongoing  ?4 Pt will demo at least 4/5 strength throughout R shoulder ?Baseline: 04/03/21 Globally 3+/5 strength 05/21/21 ongoing  ?PLAN: ?PT FREQUENCY: 1x/week ?  ?PT DURATION: 6 weeks ?  ?PLANNED INTERVENTIONS: Therapeutic exercises, Therapeutic activity, Neuro Muscular re-education, Patient/Family education, Joint mobilization, Aquatic Therapy, Dry Needling, Electrical stimulation, Spinal mobilization, Cryotherapy, Moist heat, Taping, Traction, Ionotophoresis 4mg /ml Dexamethasone, and Manual therapy ?  ?PLAN FOR NEXT SESSION: Assess HEP performance and update as needed, R trapezius inhibition strategies, posterior shoulder strengthening and functional tasks, scapular stabilization techniques, FOTO ?  ? ? , PT ?04/13/21 9:15 AM ? ? ?   ? ?

## 2021-04-14 ENCOUNTER — Emergency Department (HOSPITAL_COMMUNITY): Payer: BLUE CROSS/BLUE SHIELD

## 2021-04-14 ENCOUNTER — Encounter (HOSPITAL_COMMUNITY): Payer: Self-pay | Admitting: Pharmacy Technician

## 2021-04-14 ENCOUNTER — Emergency Department (HOSPITAL_COMMUNITY)
Admission: EM | Admit: 2021-04-14 | Discharge: 2021-04-14 | Disposition: A | Discharge status: Home / Self Care | Payer: BLUE CROSS/BLUE SHIELD | Attending: Emergency Medicine | Admitting: Emergency Medicine

## 2021-04-14 ENCOUNTER — Other Ambulatory Visit: Payer: Self-pay

## 2021-04-14 DIAGNOSIS — I10 Essential (primary) hypertension: Secondary | ICD-10-CM | POA: Insufficient documentation

## 2021-04-14 DIAGNOSIS — X58XXXA Exposure to other specified factors, initial encounter: Secondary | ICD-10-CM | POA: Diagnosis not present

## 2021-04-14 DIAGNOSIS — F445 Conversion disorder with seizures or convulsions: Secondary | ICD-10-CM | POA: Diagnosis not present

## 2021-04-14 DIAGNOSIS — S0302XA Dislocation of jaw, left side, initial encounter: Secondary | ICD-10-CM | POA: Diagnosis not present

## 2021-04-14 DIAGNOSIS — S0300XA Dislocation of jaw, unspecified side, initial encounter: Secondary | ICD-10-CM

## 2021-04-14 DIAGNOSIS — G40909 Epilepsy, unspecified, not intractable, without status epilepticus: Secondary | ICD-10-CM | POA: Insufficient documentation

## 2021-04-14 DIAGNOSIS — R7401 Elevation of levels of liver transaminase levels: Secondary | ICD-10-CM | POA: Diagnosis not present

## 2021-04-14 DIAGNOSIS — S0993XA Unspecified injury of face, initial encounter: Secondary | ICD-10-CM | POA: Diagnosis present

## 2021-04-14 LAB — I-STAT CHEM 8, ED
BUN: 12 mg/dL (ref 6–20)
Calcium, Ion: 1.15 mmol/L (ref 1.15–1.40)
Chloride: 105 mmol/L (ref 98–111)
Creatinine, Ser: 0.7 mg/dL (ref 0.44–1.00)
Glucose, Bld: 83 mg/dL (ref 70–99)
HCT: 46 % (ref 36.0–46.0)
Hemoglobin: 15.6 g/dL — ABNORMAL HIGH (ref 12.0–15.0)
Potassium: 3.8 mmol/L (ref 3.5–5.1)
Sodium: 139 mmol/L (ref 135–145)
TCO2: 23 mmol/L (ref 22–32)

## 2021-04-14 LAB — PROTIME-INR
INR: 0.9 (ref 0.8–1.2)
Prothrombin Time: 11.9 seconds (ref 11.4–15.2)

## 2021-04-14 LAB — CBC
HCT: 42.3 % (ref 36.0–46.0)
Hemoglobin: 15.3 g/dL — ABNORMAL HIGH (ref 12.0–15.0)
MCH: 30.9 pg (ref 26.0–34.0)
MCHC: 36.2 g/dL — ABNORMAL HIGH (ref 30.0–36.0)
MCV: 85.5 fL (ref 80.0–100.0)
Platelets: 225 10*3/uL (ref 150–400)
RBC: 4.95 MIL/uL (ref 3.87–5.11)
RDW: 15.1 % (ref 11.5–15.5)
WBC: 5.2 10*3/uL (ref 4.0–10.5)
nRBC: 0 % (ref 0.0–0.2)

## 2021-04-14 LAB — COMPREHENSIVE METABOLIC PANEL
ALT: 68 U/L — ABNORMAL HIGH (ref 0–44)
AST: 54 U/L — ABNORMAL HIGH (ref 15–41)
Albumin: 4.1 g/dL (ref 3.5–5.0)
Alkaline Phosphatase: 91 U/L (ref 38–126)
Anion gap: 12 (ref 5–15)
BUN: 12 mg/dL (ref 6–20)
CO2: 22 mmol/L (ref 22–32)
Calcium: 9.7 mg/dL (ref 8.9–10.3)
Chloride: 105 mmol/L (ref 98–111)
Creatinine, Ser: 0.92 mg/dL (ref 0.44–1.00)
GFR, Estimated: >60 mL/min (ref >60)
Glucose, Bld: 88 mg/dL (ref 70–99)
Potassium: 3.7 mmol/L (ref 3.5–5.1)
Sodium: 139 mmol/L (ref 135–145)
Total Bilirubin: 1 mg/dL (ref 0.3–1.2)
Total Protein: 7.4 g/dL (ref 6.5–8.1)

## 2021-04-14 LAB — APTT: aPTT: 26 seconds (ref 24–36)

## 2021-04-14 LAB — I-STAT BETA HCG BLOOD, ED (MC, WL, AP ONLY): I-stat hCG, quantitative: 12.2 m[IU]/mL — ABNORMAL HIGH (ref <5)

## 2021-04-14 LAB — DIFFERENTIAL
Abs Immature Granulocytes: 0.02 10*3/uL (ref 0.00–0.07)
Basophils Absolute: 0 10*3/uL (ref 0.0–0.1)
Basophils Relative: 0 %
Eosinophils Absolute: 0 10*3/uL (ref 0.0–0.5)
Eosinophils Relative: 0 %
Immature Granulocytes: 0 %
Lymphocytes Relative: 33 %
Lymphs Abs: 1.7 10*3/uL (ref 0.7–4.0)
Monocytes Absolute: 0.4 10*3/uL (ref 0.1–1.0)
Monocytes Relative: 8 %
Neutro Abs: 3.1 10*3/uL (ref 1.7–7.7)
Neutrophils Relative %: 59 %

## 2021-04-14 MED ORDER — PROPOFOL 10 MG/ML IV BOLUS
INTRAVENOUS | Status: AC | PRN
Start: 2021-04-14 — End: 2021-04-14
  Administered 2021-04-14 (×2): 20 mg via INTRAVENOUS
  Administered 2021-04-14: 39.9967 mg via INTRAVENOUS

## 2021-04-14 MED ORDER — LEVETIRACETAM IN NACL 1500 MG/100ML IV SOLN
1500.0000 mg | Freq: Once | INTRAVENOUS | Status: AC
Start: 2021-04-14 — End: 2021-04-14
  Administered 2021-04-14: 1500 mg via INTRAVENOUS
  Filled 2021-04-14: qty 100

## 2021-04-14 MED ORDER — MORPHINE SULFATE (PF) 2 MG/ML IV SOLN
2.0000 mg | Freq: Once | INTRAVENOUS | Status: AC
  Administered 2021-04-14: 2 mg via INTRAVENOUS
  Filled 2021-04-14: qty 1

## 2021-04-14 MED ORDER — LORAZEPAM 2 MG/ML IJ SOLN
2.0000 mg | Freq: Once | INTRAMUSCULAR | Status: AC
  Administered 2021-04-14: 2 mg via INTRAMUSCULAR
  Filled 2021-04-14: qty 1

## 2021-04-14 MED ORDER — SODIUM CHLORIDE 0.9% FLUSH: 3.0000 mL | Freq: Once | INTRAVENOUS | Status: DC

## 2021-04-14 MED ORDER — FENTANYL CITRATE PF 50 MCG/ML IJ SOSY
50.0000 ug | PREFILLED_SYRINGE | Freq: Once | INTRAMUSCULAR | Status: AC
  Administered 2021-04-14: 50 ug via INTRAVENOUS
  Filled 2021-04-14: qty 1

## 2021-04-14 MED ORDER — PROPOFOL 10 MG/ML IV BOLUS
0.5000 mg/kg | Freq: Once | INTRAVENOUS | Status: DC
  Filled 2021-04-14: qty 20

## 2021-04-14 MED ORDER — METHOCARBAMOL 500 MG PO TABS: 500.0000 mg | ORAL_TABLET | Freq: Two times a day (BID) | ORAL | 0 refills | Status: DC

## 2021-04-14 NOTE — ED Provider Triage Note (Signed)
Emergency Medicine Provider Triage Evaluation Note ? ?Shari Gonzales , a 61 y.o. female  was evaluated in triage.  Pt complains of probable seizure last night, questionable duration may be around 8 minutes according to daughter.  Patient with a history of seizure disorder but does not take any medication.  This morning she has left-sided facial paralysis/contraction, some dysarthria secondary to facial contraction, and headache.  She denies any chest pain, fever, shortness of breath.  She was scared that she may be having a stroke.  No history of similar response. ? ?Review of Systems  ?Positive: Facial droop, headache ?Negative: Weakness, confusion ? ?Physical Exam  ?BP (!) 176/138 (BP Location: Left Arm)   Pulse 86   Temp 98.5 ?F (36.9 ?C) (Oral)   Resp 18   SpO2 98%  ?Gen:   Awake, some distress ?Resp:  Normal effort  ?MSK:   Moves extremities without difficulty  ?Other:  Left sided facial weakness, dyarthria, difference in sensation subjectively ? ?Medical Decision Making  ?Medically screening exam initiated at 9:50 AM.  Appropriate orders placed.  Keishawna Carranza was informed that the remainder of the evaluation will be completed by another provider, this initial triage assessment does not replace that evaluation, and the importance of remaining in the ED until their evaluation is complete. ? ?Workup initiated, ativan admin from triage per Dr. Jeraldine Loots discussion ?  ?Olene Floss, PA-C ?04/14/21 4034 ? ?

## 2021-04-14 NOTE — ED Notes (Signed)
EEG at the bedside  ?

## 2021-04-14 NOTE — ED Provider Notes (Signed)
?3:41 PM ?Received signout from previous provider, please see her note for complete H&P.  This is a 61 year old female who initially was brought here due to concerns of speech changes.  Initially a code stroke was activated however upon further exam it appears that patient has a dislocated left jaw.  On her jaw was subsequently relocated.  Neurology was also involved given concerns for seizure disorder that may have potentially triggered this jaw dislocation.  An MRI EEG was initially ordered.  Fortunately results of the MRI and EEG which was independently viewed interpreted by me and did not show any concerning finding.  Neurologist also have reviewed the results of both and felt that patient is stable to be discharged home.  Patient is cleared from a neurology standpoint.  Patient will need to follow-up with ENT for further managements of her jaw dislocation.  Patient discharged home with Robaxin ? ?BP (!) 132/99   Pulse 75   Temp 98.5 ?F (36.9 ?C) (Oral)   Resp 17   Ht 5' (1.524 m)   Wt 72.6 kg   SpO2 97%   BMI 31.25 kg/m?  ? ?Results for orders placed or performed during the hospital encounter of 04/14/21  ?Protime-INR  ?Result Value Ref Range  ? Prothrombin Time 11.9 11.4 - 15.2 seconds  ? INR 0.9 0.8 - 1.2  ?APTT  ?Result Value Ref Range  ? aPTT 26 24 - 36 seconds  ?CBC  ?Result Value Ref Range  ? WBC 5.2 4.0 - 10.5 K/uL  ? RBC 4.95 3.87 - 5.11 MIL/uL  ? Hemoglobin 15.3 (H) 12.0 - 15.0 g/dL  ? HCT 42.3 36.0 - 46.0 %  ? MCV 85.5 80.0 - 100.0 fL  ? MCH 30.9 26.0 - 34.0 pg  ? MCHC 36.2 (H) 30.0 - 36.0 g/dL  ? RDW 15.1 11.5 - 15.5 %  ? Platelets 225 150 - 400 K/uL  ? nRBC 0.0 0.0 - 0.2 %  ?Differential  ?Result Value Ref Range  ? Neutrophils Relative % 59 %  ? Neutro Abs 3.1 1.7 - 7.7 K/uL  ? Lymphocytes Relative 33 %  ? Lymphs Abs 1.7 0.7 - 4.0 K/uL  ? Monocytes Relative 8 %  ? Monocytes Absolute 0.4 0.1 - 1.0 K/uL  ? Eosinophils Relative 0 %  ? Eosinophils Absolute 0.0 0.0 - 0.5 K/uL  ? Basophils  Relative 0 %  ? Basophils Absolute 0.0 0.0 - 0.1 K/uL  ? Immature Granulocytes 0 %  ? Abs Immature Granulocytes 0.02 0.00 - 0.07 K/uL  ?Comprehensive metabolic panel  ?Result Value Ref Range  ? Sodium 139 135 - 145 mmol/L  ? Potassium 3.7 3.5 - 5.1 mmol/L  ? Chloride 105 98 - 111 mmol/L  ? CO2 22 22 - 32 mmol/L  ? Glucose, Bld 88 70 - 99 mg/dL  ? BUN 12 6 - 20 mg/dL  ? Creatinine, Ser 0.92 0.44 - 1.00 mg/dL  ? Calcium 9.7 8.9 - 10.3 mg/dL  ? Total Protein 7.4 6.5 - 8.1 g/dL  ? Albumin 4.1 3.5 - 5.0 g/dL  ? AST 54 (H) 15 - 41 U/L  ? ALT 68 (H) 0 - 44 U/L  ? Alkaline Phosphatase 91 38 - 126 U/L  ? Total Bilirubin 1.0 0.3 - 1.2 mg/dL  ? GFR, Estimated >60 >60 mL/min  ? Anion gap 12 5 - 15  ?I-stat chem 8, ED  ?Result Value Ref Range  ? Sodium 139 135 - 145 mmol/L  ? Potassium 3.8 3.5 -  5.1 mmol/L  ? Chloride 105 98 - 111 mmol/L  ? BUN 12 6 - 20 mg/dL  ? Creatinine, Ser 0.70 0.44 - 1.00 mg/dL  ? Glucose, Bld 83 70 - 99 mg/dL  ? Calcium, Ion 1.15 1.15 - 1.40 mmol/L  ? TCO2 23 22 - 32 mmol/L  ? Hemoglobin 15.6 (H) 12.0 - 15.0 g/dL  ? HCT 46.0 36.0 - 46.0 %  ?I-Stat beta hCG blood, ED  ?Result Value Ref Range  ? I-stat hCG, quantitative 12.2 (H) <5 mIU/mL  ? Comment 3          ? ?CT HEAD WO CONTRAST ? ?Result Date: 04/14/2021 ?CLINICAL DATA:  Neurological deficit EXAM: CT HEAD WITHOUT CONTRAST TECHNIQUE: Contiguous axial images were obtained from the base of the skull through the vertex without intravenous contrast. RADIATION DOSE REDUCTION: This exam was performed according to the departmental dose-optimization program which includes automated exposure control, adjustment of the mA and/or kV according to patient size and/or use of iterative reconstruction technique. COMPARISON:  05/13/2015 FINDINGS: Brain: No evidence of acute infarction, hemorrhage, extra-axial collection, ventriculomegaly, or mass effect. Generalized cerebral atrophy. Periventricular white matter low attenuation likely secondary to microangiopathy.  Vascular: No hyperdense vessel. No significant intracranial atherosclerotic disease. Skull: Negative for fracture or focal lesion. Sinuses/Orbits: Visualized portions of the orbits are unremarkable. Visualized portions of the paranasal sinuses are unremarkable. Visualized portions of the mastoid air cells are unremarkable. Other: None. IMPRESSION: 1. No acute intracranial pathology. 2. Chronic small vessel ischemic changes and cerebral atrophy. Electronically Signed   By: Kathreen Devoid M.D.   On: 04/14/2021 10:46  ? ?MR BRAIN WO CONTRAST ? ?Result Date: 04/14/2021 ?CLINICAL DATA:  Seizure, new-onset, no history of trauma. EXAM: MRI HEAD WITHOUT CONTRAST TECHNIQUE: Multiplanar, multiecho pulse sequences of the brain and surrounding structures were obtained without intravenous contrast. COMPARISON:  Head CT April 14, 2021. FINDINGS: Brain: No acute infarction, hemorrhage, hydrocephalus, extra-axial collection or mass lesion. Scattered and confluent foci of T2 hyperintensity are seen within the white matter of the cerebral hemispheres, nonspecific, most likely related to chronic small vessel ischemia. Mesial temporal lobes are symmetric and with normal signal characteristics. Vascular: Normal flow voids. Skull and upper cervical spine: Normal marrow signal. Sinuses/Orbits: Negative. Other: None. 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 Electronically Signed   By: Pedro Earls M.D.   On: 04/14/2021 16:05  ? ?MR Shoulder Right Wo Contrast ? ?Result Date: 03/17/2021 ?CLINICAL DATA:  Shoulder trauma.  Rotator cuff tear suspected. EXAM: MRI OF THE RIGHT SHOULDER WITHOUT CONTRAST TECHNIQUE: Multiplanar, multisequence MR imaging of the shoulder was performed. No intravenous contrast was administered. COMPARISON:  Right shoulder radiographs 01/26/2021 FINDINGS: Rotator cuff: There is a tiny linear midsubstance  partial-thickness tear of the anterior supraspinatus tendon footprint measuring only 1-2 mm in transverse dimension, 1-2 mm in AP dimension, and extending up to 4 mm along the longitudinal length of the tendon (coronal series 7, image 10). Infraspinatus subscapularis and teres minor are intact. Muscles: No rotator cuff muscle atrophy, fatty infiltration, or edema. Biceps long head: The intra-articular long head of the biceps tendon is intact. Acromioclavicular Joint: There are moderate degenerative changes of the acromioclavicular joint including joint space narrowing, subchondral marrow edema, and peripheral osteophytosis. Type II acromion. Mild subacromial/subdeltoid bursitis. Glenohumeral Joint: Mild-to-moderate thinning of the glenoid and humeral head cartilage. Labrum: Grossly intact, but evaluation is limited by lack of intraarticular fluid. Bones:  No acute fracture. Other: None. IMPRESSION:: IMPRESSION: 1. Tiny linear midsubstance tear of the anterior supraspinatus tendon footprint. No tendon retraction. 2. Moderate degenerative changes of the acromioclavicular joint. 3. Mild-to-moderate thinning of the glenoid and humeral head cartilage. Electronically Signed   By: Yvonne Kendall M.D.   On: 03/17/2021 14:58  ? ?EEG adult ? ?Result Date: 04/14/2021 ?Lora Havens, MD     04/14/2021  6:15 PM Patient Name: Tabrina Misiewicz MRN: [id number] Epilepsy Attending: Lora Havens Referring Physician/Provider: Dr Zeb Comfort Date: 04/14/2021 Duration: 22.45 mins Patient history: 61 year old female with seizure-like activity.  EEG to evaluate for seizure. Level of alertness: Awake, asleep AEDs during EEG study: LEV Technical aspects: This EEG study was done with scalp electrodes positioned according to the 10-20 International system of electrode placement. Electrical activity was acquired at a sampling rate of 500Hz  and reviewed with a high frequency filter of 70Hz  and a low frequency filter of 1Hz . EEG data were  recorded continuously and digitally stored. Description: The posterior dominant rhythm consists of 8 Hz activity of moderate voltage (25-35 uV) seen predominantly in posterior head regions, symmetric and reactive to eye o

## 2021-04-14 NOTE — ED Notes (Signed)
Dr Roslynn Amble at bedside while consents signed ?

## 2021-04-14 NOTE — ED Provider Notes (Addendum)
?[facility] EMERGENCY DEPARTMENT ?Provider Note ? ? ?CSN: [id number] ?Arrival date & time: 04/14/21  7793 ? ?  ? ?History ? ?Chief Complaint  ?Patient presents with  ? Stroke Symptoms  ? ? ?Lasharon Dunivan is a 61 y.o. female with PMHx HTN, HLD, Seizures, who presents to the ED today with complaint of L sided jaw pain that began sometime yesterday.  Family is at bedside with patient.  They report that they spoke to patient yesterday sometime in the morning hours.  She did not have any speech changes at that time.  Daughter called her again around 9 PM last night and noted that her speech sounded slightly off.  Patient has been complaining of some left-sided jaw pain at that time.  Daughter advised to place my somnolent and question if she could have a dental abscess.  She called her again this morning noted that her speech seemed worse prompting ED visit.  Daughter is concerned that she could have had a stroke.  No history of same.  Per triage report patient had endorsed seizure last night.  Family is unsure if she had a seizure.  Patient states she is also unsure.  It does appear that she may have had a couple of seizures the day before.  Family is unsure which she takes for seizure prevention - they admit that they are unsure if she is compliant with her medications.  ? ?The history is provided by the patient, a relative and medical records.  ? ?  ? ?Home Medications ?Prior to Admission medications   ?Medication Sig Start Date End Date Taking? Authorizing Provider  ?HYDROcodone-acetaminophen (NORCO/VICODIN) 5-325 MG tablet Take 1 tablet by mouth every 6 (six) hours as needed for severe pain. ?Patient not taking: Reported on 04/14/2021 01/03/21   Renne Crigler, PA-C  ?meloxicam (MOBIC) 15 MG tablet TAKE 1 TABLET BY MOUTH DAILY FOR 14 DAYS ?Patient not taking: Reported on 04/14/2021 03/09/21   Orion Crook I, NP  ?Vitamin D, Ergocalciferol, (DRISDOL) 1.25 MG (50000 UT) CAPS capsule Take 1 capsule  (50,000 Units total) by mouth every 7 (seven) days. ?Patient not taking: Reported on 04/14/2021 03/06/18   Kallie Locks, FNP  ?   ? ?Allergies    ?Patient has no known allergies.   ? ?Review of Systems   ?Review of Systems  ?Constitutional:  Negative for chills and fever.  ?HENT:    ?     + left sided jaw pain  ?Eyes:  Negative for visual disturbance.  ?Respiratory:  Negative for shortness of breath.   ?Cardiovascular:  Negative for chest pain.  ?Gastrointestinal:  Negative for nausea and vomiting.  ?Neurological:  Positive for speech difficulty.  ?All other systems reviewed and are negative. ? ?Physical Exam ?Updated Vital Signs ?BP (!) 139/105   Pulse (!) 108   Temp 98.5 ?F (36.9 ?C) (Oral)   Resp 16   Wt 73 kg   SpO2 99%   BMI 31.43 kg/m?  ?Physical Exam ?Vitals and nursing note reviewed.  ?Constitutional:   ?   Appearance: She is not ill-appearing or diaphoretic.  ?HENT:  ?   Head:  ?   Comments: Left jaw appears contracted toward the right side. Unable to fully open and close jaw. + TTP to left mandible at TMJ insertion site. + palpable mandibular condyle.  ?Eyes:  ?   Extraocular Movements: Extraocular movements intact.  ?   Conjunctiva/sclera: Conjunctivae normal.  ?   Pupils: Pupils are equal, round,  and reactive to light.  ?Cardiovascular:  ?   Rate and Rhythm: Normal rate and regular rhythm.  ?   Pulses: Normal pulses.  ?Pulmonary:  ?   Effort: Pulmonary effort is normal.  ?   Breath sounds: Normal breath sounds. No wheezing, rhonchi or rales.  ?Abdominal:  ?   Palpations: Abdomen is soft.  ?   Tenderness: There is no abdominal tenderness.  ?Musculoskeletal:  ?   Cervical back: Neck supple.  ?Skin: ?   General: Skin is warm and dry.  ?Neurological:  ?   Mental Status: She is alert.  ?   Comments: Alert and oriented to self, place, time and event.  ? ?Speech with dysarthria.  ? ?Strength 4/5 to RUE (hx of shoulder pain). 5/5 to LUE. 5/5 to BLEs.  ?Sensation intact in upper/lower extremities   ? ?Normal gait.  ?Negative Romberg. No pronator drift.  ?Normal finger-to-nose and feet tapping.  ?CN I not tested  ?CN II grossly intact visual fields bilaterally. Did not visualize posterior eye.  ?CN III, IV, VI PERRLA and EOMs intact bilaterally  ?CN V Intact sensation to sharp and light touch to the face  ?CN VII facial movements symmetric  ?CN VIII not tested  ?CN IX, X no uvula deviation, symmetric rise of soft palate  ?CN XI 5/5 SCM and trapezius strength bilaterally  ?CN XII Midline tongue protrusion, symmetric L/R movements   ? ? ?ED Results / Procedures / Treatments   ?Labs ?(all labs ordered are listed, but only abnormal results are displayed) ?Labs Reviewed  ?CBC - Abnormal; Notable for the following components:  ?    Result Value  ? Hemoglobin 15.3 (*)   ? MCHC 36.2 (*)   ? All other components within normal limits  ?COMPREHENSIVE METABOLIC PANEL - Abnormal; Notable for the following components:  ? AST 54 (*)   ? ALT 68 (*)   ? All other components within normal limits  ?I-STAT CHEM 8, ED - Abnormal; Notable for the following components:  ? Hemoglobin 15.6 (*)   ? All other components within normal limits  ?I-STAT BETA HCG BLOOD, ED ([facility], [facility], [facility] ONLY) - Abnormal; Notable for the following components:  ? I-stat hCG, quantitative 12.2 (*)   ? All other components within normal limits  ?PROTIME-INR  ?APTT  ?DIFFERENTIAL  ?CBG MONITORING, ED  ? ? ?EKG ?EKG Interpretation ? ?Date/Time:  Tuesday April 14 2021 09:21:40 EDT ?Ventricular Rate:  81 ?PR Interval:  134 ?QRS Duration: 76 ?QT Interval:  384 ?QTC Calculation: 446 ?R Axis:   14 ?Text Interpretation: Normal sinus rhythm Minimal voltage criteria for LVH, may be normal variant ( R in aVL ) Cannot rule out Anterior infarct , age undetermined Abnormal ECG No previous ECGs available Confirmed by Marianna Fussykstra, Richard ([id number]) on 04/14/2021 12:32:04 PM ? ?Radiology ?CT HEAD WO CONTRAST ? ?Result Date: 04/14/2021 ?CLINICAL DATA:  Neurological deficit EXAM: CT HEAD  WITHOUT CONTRAST TECHNIQUE: Contiguous axial images were obtained from the base of the skull through the vertex without intravenous contrast. RADIATION DOSE REDUCTION: This exam was performed according to the departmental dose-optimization program which includes automated exposure control, adjustment of the mA and/or kV according to patient size and/or use of iterative reconstruction technique. COMPARISON:  05/13/2015 FINDINGS: Brain: No evidence of acute infarction, hemorrhage, extra-axial collection, ventriculomegaly, or mass effect. Generalized cerebral atrophy. Periventricular white matter low attenuation likely secondary to microangiopathy. Vascular: No hyperdense vessel. No significant intracranial atherosclerotic disease. Skull: Negative for fracture or  focal lesion. Sinuses/Orbits: Visualized portions of the orbits are unremarkable. Visualized portions of the paranasal sinuses are unremarkable. Visualized portions of the mastoid air cells are unremarkable. Other: None. IMPRESSION: 1. No acute intracranial pathology. 2. Chronic small vessel ischemic changes and cerebral atrophy. Electronically Signed   By: Elige Ko M.D.   On: 04/14/2021 10:46  ? ?CT Maxillofacial Wo Contrast ? ?Result Date: 04/14/2021 ?CLINICAL DATA:  Concern for left-sided jaw dislocation. EXAM: CT MAXILLOFACIAL WITHOUT CONTRAST TECHNIQUE: Multidetector CT imaging of the maxillofacial structures was performed. Multiplanar CT image reconstructions were also generated. RADIATION DOSE REDUCTION: This exam was performed according to the departmental dose-optimization program which includes automated exposure control, adjustment of the mA and/or kV according to patient size and/or use of iterative reconstruction technique. COMPARISON:  None. FINDINGS: Osseous: The left mandibular condyle is anteriorly dislocated, fixed anterior to the articular tubercle of the zygomatic arch. No definite fracture is seen within the mandible. There is  degenerative spurring within the anterior aspect of the right temporal condyle which abuts the articular eminence of the right zygomatic arch and is approximately 4 mm anteriorly subluxed without dislocation (sagittal serie

## 2021-04-14 NOTE — Discharge Instructions (Addendum)
Please follow up with Dr. Janace [facility] for further evaluation of your jaw dislocation. It is recommended that you wear the ace wrap around your face for the next couple of days to help with stabilization.  ? ?Eat softer foods for the next few days and prevent wide opening of your jaw. Fortunately your brain MRI and EEG did not show any evidence concerning for stroke or seizure.   ?

## 2021-04-14 NOTE — ED Notes (Signed)
RN assumed responsibility of care at this time. 1553 ?

## 2021-04-14 NOTE — Progress Notes (Signed)
EEG completed, results pending. 

## 2021-04-14 NOTE — ED Triage Notes (Signed)
Pt here with reports of L sided facial droop, numbness, headache along with dysphagia. Pt with hx of epilepsy, not currently taking medication for seizures. Pt endorses seizure last night. Family states they noticed slurred speech around 2130 yesterday.  ?

## 2021-04-14 NOTE — Consult Note (Signed)
Neurology Consultation ?Reason for Consult: seizure ?Referring Physician: Dr Marianna Fuss ? ?CC: seizure ? ?History is obtained from: Patient, chart review ? ?HPI: Shari Gonzales is a 61 y.o. female with past medical history of hypertension, hyperlipidemia, epilepsy who presented with left-sided jaw pain and speech disturbance.  On arrival, patient was diagnosed with left-sided jaw dislocation.  Family also reported history of seizures and therefore patient was loaded with Keppra and Neurology was consulted. ? ?Patient has trouble providing history because of dislocated jaw.  Therefore daughter and husband at bedside provided most of the history.  Per family patient has been having seizure-like episodes for about 10 years.  States during the episode patient states she is about to have a seizure, sit down, she then appears stated, tongue sticking out with eye flutter.  Sometimes she will grab a napkin and cover her tongue as if to prevent biting her tongue.  These episodes last for a few minutes followed by return to baseline.  No incontinence or tongue bite.  Per daughter, these episodes happen more frequently when patient has had multiple beers (more than 7).  Family also reports patient has been drinking more alcohol than usual since last September when she lost her son.  Has never seen a neurologist and has not been on any antiseizure medications. ? ?ROS: All other systems reviewed and negative except as noted in the HPI.  ? ?Past Medical History:  ?Diagnosis Date  ? Hyperkalemia   ? Hyperlipidemia   ? Hypertension   ? Seizures (HCC)   ? Vitamin D deficiency   ? ? ?Family History  ?Family history unknown: Yes  ? ?Social History: Per chart review, she has never smoked. She has never used smokeless tobacco. She reports that she does not currently use drugs.  Per family, patient drinks 5-7 beers daily. ? ?Exam: ?Current vital signs: ?BP (!) 159/106   Pulse 80   Temp 98.5 ?F (36.9 ?C) (Oral)   Resp 16   Wt  73 kg   SpO2 98%   BMI 31.43 kg/m?  ?Vital signs in last 24 hours: ?Temp:  [98.5 ?F (36.9 ?C)] 98.5 ?F (36.9 ?C) (03/28 0932) ?Pulse Rate:  [80-89] 80 (03/28 1313) ?Resp:  [16-18] 16 (03/28 1313) ?BP: (125-176)/(100-138) 159/106 (03/28 1313) ?SpO2:  [98 %-100 %] 98 % (03/28 1313) ?Weight:  [73 kg] 73 kg (03/28 1354) ? ? ?Physical Exam  ?Constitutional: Appears well-developed and well-nourished.  ?Eyes: No scleral injection ?HENT: No OP obstrucion ?Head: Normocephalic.  ?Cardiovascular: Normal rate and regular rhythm.  ?Respiratory: Effort normal, non-labored breathing ?Neuro: Awake, alert, oriented to self, follows commands, PERRLA, EOMI, no apparent facial asymmetry, antigravity strength in all 4 extremities ? ?I have reviewed labs in epic and the results pertinent to this consultation are: ?CBC:  ?Recent Labs  ?Lab 04/14/21 ?9371 04/14/21 ?1008  ?WBC 5.2  --   ?NEUTROABS 3.1  --   ?HGB 15.3* 15.6*  ?HCT 42.3 46.0  ?MCV 85.5  --   ?PLT 225  --   ? ? ?Basic Metabolic Panel:  ?Lab Results  ?Component Value Date  ? NA 139 04/14/2021  ? K 3.8 04/14/2021  ? CO2 22 04/14/2021  ? GLUCOSE 83 04/14/2021  ? BUN 12 04/14/2021  ? CREATININE 0.70 04/14/2021  ? CALCIUM 9.7 04/14/2021  ? GFRNONAA >60 04/14/2021  ? GFRAA >60 10/04/2019  ? ?Lipid Panel:  ?Lab Results  ?Component Value Date  ? LDLCALC 96 02/23/2021  ? ?HgbA1c:  ?Lab Results  ?Component  Value Date  ? HGBA1C 5.5 02/23/2021  ? ?Urine Drug Screen: No results found for: LABOPIA, COCAINSCRNUR, LABBENZ, AMPHETMU, THCU, LABBARB  ?Alcohol Level No results found for: ETH ? ? ?I have reviewed the images obtained: ? ?CT head without contrast 04/14/2021: No acute intracranial pathology. ?Chronic small vessel ischemic changes and cerebral atrophy. ? ?ASSESSMENT/PLAN: 61 year old female with history of alcohol use disorder who presented with jaw dislocation. ? ?Seizure-like episode ?Alcohol use disorder ?Transaminitis ?Jaw dislocation ?-Semiology of the episode sounds suspicious  for nonepileptic events. ? ?Recommendations ?-We will obtain EEG to look for potential epileptogenicity ?- We will obtain MRI brain without contrast to look for any acute abnormality ?-If both EEG and MRI brain are normal, would not start on any AEDs. ?-I did discuss the diagnosis of nonepileptic events with patient's family at bedside.  Family understands and agrees with the diagnosis ?-Discussed alcohol cessation counseling ?-If episodes persist, can consider epilepsy monitoring unit admission for further characterization ?-I did discuss driving restrictions.  However, patient does not drive per family ?-Discussed my plan with ED physician Dr. Ephraim Hamburgeryskstra.. ? ? ?Seizure precautions: ?[place] [facility] [place], patients with seizures are not allowed to drive until they have been seizure-free for six months and cleared by a physician  ?  ?Use caution when using heavy equipment or power tools. Avoid working on ladders or at heights. Take showers instead of baths. Ensure the water temperature is not too high on the home water heater. Do not go swimming alone. Do not lock yourself in a room alone (i.e. bathroom). When caring for infants or small children, sit down when holding, feeding, or changing them to minimize risk of injury to the child in the event you have a seizure. Maintain good sleep hygiene. Avoid alcohol.  ?  ?If patient has another seizure, call 911 and bring them back to the ED if: ?A.  The seizure lasts longer than 5 minutes.      ?B.  The patient doesn't wake shortly after the seizure or has new problems such as difficulty seeing, speaking or moving following the seizure ?C.  The patient was injured during the seizure ?D.  The patient has a temperature over 102 F (39C) ?E.  The patient vomited during the seizure and now is having trouble breathing ?   ?During the Seizure ?  ?- First, ensure adequate ventilation and place patients on the floor on their left side  ?Loosen clothing around the neck and  ensure the airway is patent. If the patient is clenching the teeth, do not force the mouth open with any object as this can cause severe damage ?- Remove all items from the surrounding that can be hazardous. The patient may be oblivious to what's happening and may not even know what he or she is doing. ?If the patient is confused and wandering, either gently guide him/her away and block access to outside areas ?- Reassure the individual and be comforting ?- Call 911. In most cases, the seizure ends before EMS arrives. However, there are cases when seizures may last over 3 to 5 minutes. Or the individual may have developed breathing difficulties or severe injuries. If a pregnant patient or a person with diabetes develops a seizure, it is prudent to call an ambulance. ?- Finally, if the patient does not regain full consciousness, then call EMS. Most patients will remain confused for about 45 to 90 minutes after a seizure, so you must use judgment in calling for help. ? ?  After the Seizure (Postictal Stage) ?  ?After a seizure, most patients experience confusion, fatigue, muscle pain and/or a headache. Thus, one should permit the individual to sleep. For the next few days, reassurance is essential. Being calm and helping reorient the person is also of importance. ?  ?Most seizures are painless and end spontaneously. Seizures are not harmful to others but can lead to complications such as stress on the lungs, brain and the heart. Individuals with prior lung problems may develop labored breathing and respiratory distress.  ? ? ?Thank you for allowing [place] to participate in the care of this patient. If you have any further questions, please contact  me or neurohospitalist.  ? ?Lindie Spruce ?Epilepsy ?[facility] ? ? ? ? ?

## 2021-04-14 NOTE — ED Notes (Signed)
Patient transported to MRI 

## 2021-04-14 NOTE — Progress Notes (Signed)
Pt unavailable for her EEG at this time. Pt is at MRI. Will attempt at a later time when schedule permits ?

## 2021-04-14 NOTE — Procedures (Addendum)
Patient Name: Lucerito Rosinski  ?MRN: [id number]  ?Epilepsy Attending: Charlsie Quest  ?Referring Physician/Provider: Dr Lindie Spruce ?Date: 04/14/2021 ?Duration: 22.25 mins ? ?Patient history: 61 year old female with seizure-like activity.  EEG to evaluate for seizure. ? ?Level of alertness: Awake, asleep ? ?AEDs during EEG study: LEV ? ?Technical aspects: This EEG study was done with scalp electrodes positioned according to the 10-20 International system of electrode placement. Electrical activity was acquired at a sampling rate of 500Hz  and reviewed with a high frequency filter of 70Hz  and a low frequency filter of 1Hz . EEG data were recorded continuously and digitally stored.  ? ?Description: The posterior dominant rhythm consists of 8 Hz activity of moderate voltage (25-35 uV) seen predominantly in posterior head regions, symmetric and reactive to eye opening and eye closing. Sleep was characterized by vertex waves, sleep spindles (12 to 14 Hz), maximal frontocentral region. Hyperventilation and photic stimulation were not performed.    ? ?IMPRESSION: ?This study is within normal limits. No seizures or epileptiform discharges were seen throughout the recording. ? ?  ? ?

## 2021-04-15 NOTE — ED Provider Notes (Signed)
.  Sedation ? ?Date/Time: 04/15/2021 1:32 PM ?Performed by: Lucrezia Starch, MD ?Authorized by: Lucrezia Starch, MD  ? ?Consent:  ?  Consent obtained:  Verbal and written ?  Consent given by:  Patient ?  Risks discussed:  Allergic reaction, dysrhythmia, inadequate sedation, nausea, vomiting, respiratory compromise necessitating ventilatory assistance and intubation, prolonged sedation necessitating reversal and prolonged hypoxia resulting in organ damage ?  Alternatives discussed:  Analgesia without sedation ?Universal protocol:  ?  Immediately prior to procedure, a time out was called: yes   ?Indications:  ?  Procedure performed:  Dislocation reduction ?Pre-sedation assessment:  ?  Time since last food or drink:  Greater than 8 hours ?  ASA classification: class 1 - normal, healthy patient   ?  Mouth opening:  1 finger width ?  Mallampati score:  III - soft palate, base of uvula visible ?  Neck mobility: normal   ?  Pre-sedation assessments completed and reviewed: airway patency, cardiovascular function, hydration status, mental status, nausea/vomiting, pain level, respiratory function and temperature   ?Immediate pre-procedure details:  ?  Reviewed: vital signs   ?  Verified: bag valve mask available, emergency equipment available, intubation equipment available, IV patency confirmed, oxygen available, reversal medications available and suction available   ?Procedure details (see MAR for exact dosages):  ?  Preoxygenation:  Nasal cannula ?  Sedation:  Propofol ?  Intended level of sedation: moderate (conscious sedation) ?  Intra-procedure monitoring:  Blood pressure monitoring, cardiac monitor, continuous pulse oximetry, continuous capnometry, frequent LOC assessments and frequent vital sign checks ?  Intra-procedure events: none   ?  Total Provider sedation time (minutes):  15 ?Post-procedure details:  ?  Attendance: Constant attendance by certified staff until patient recovered   ?  Recovery: Patient returned  to pre-procedure baseline   ?  Post-sedation assessments completed and reviewed: airway patency, cardiovascular function, hydration status, mental status, nausea/vomiting, pain level, respiratory function and temperature   ?  Patient is stable for discharge or admission: yes   ?  Procedure completion:  Tolerated well, no immediate complications ? ?  ?Lucrezia Starch, MD ?04/15/21 1333 ? ?

## 2021-04-20 ENCOUNTER — Ambulatory Visit: Payer: BLUE CROSS/BLUE SHIELD | Attending: Nurse Practitioner

## 2021-04-20 DIAGNOSIS — M25511 Pain in right shoulder: Secondary | ICD-10-CM | POA: Diagnosis present

## 2021-04-20 DIAGNOSIS — M7501 Adhesive capsulitis of right shoulder: Secondary | ICD-10-CM | POA: Insufficient documentation

## 2021-04-20 DIAGNOSIS — M6281 Muscle weakness (generalized): Secondary | ICD-10-CM

## 2021-04-20 NOTE — Therapy (Signed)
?OUTPATIENT PHYSICAL THERAPY TREATMENT NOTE/RE-EVAL ? ? ?Patient Name: Shari Gonzales ?MRN: [id number] ?DOB:02/05/1960, 61 y.o., female ?Today's Date: 04/20/2021 ? ?PCP: Teena Dunk, NP ?REFERRING PROVIDER: Teena Dunk, NP ? ? PT End of Session - 04/20/21 1036   ? ? Visit Number [phone]   ? Number of Visits 17   ? Date for PT Re-Evaluation 04/17/21   ? Authorization Type BCBS   ? PT Start Time 1045   ? PT Stop Time 1123   ? PT Time Calculation (min) 38 min   ? Activity Tolerance Patient tolerated treatment well   ? Behavior During Therapy [facility] for tasks assessed/performed   ? ?  ?  ? ?  ? ? ? ? ?Past Medical History:  ?Diagnosis Date  ? Hyperkalemia   ? Hyperlipidemia   ? Hypertension   ? Seizures ([place])   ? Vitamin D deficiency   ? ?History reviewed. No pertinent surgical history. ?Patient Active Problem List  ? Diagnosis Date Noted  ? Dental caries 03/07/2018  ? HTN (hypertension) 07/12/2016  ? Hot flashes 07/12/2016  ? Concussion with loss of consciousness 05/14/2015  ? ? ?REFERRING DIAG: S43.004A (ICD-10-CM) - Dislocation of right shoulder joint, initial encounter ? ?THERAPY DIAG: R shoulder adhesive capsulitis  ? ? ?PERTINENT HISTORY: Seizures, vit D deficiency  ? ?PRECAUTIONS: None ? ?SUBJECTIVE:  ?Pt presents to PT with continued reports of shoulder pain and discomfort. Has been compliant with HEP per reports. Pt is ready to begin PT at this time.  ? ?PAIN:  ?Are you having pain? Yes ?NPRS scale: 8/10 ?Pain location: Rt UE ?Pain description:  throbbing   ?Aggravating factors: constatnt pain ?Relieving factors: exercises feel good ? ? ?OBJECTIVE:  ?  ?PATIENT SURVEYS:  ?FOTO 45 ?  ?COGNITION: ?         Overall cognitive status: Within functional limits for tasks assessed ?                              ?SENSATION: ?         Reports some numbness and shooting pain ?  ?POSTURE: ?Rt shoulder elevation at rest ?  ?PALPATION: ?Gross TTP ?  ?UPPER EXTREMITY AROM/PROM:  PROM is full with stretchy end feel, pain  reported into arm rather than at shoulder; all AROM pain reported- rubs from shoulder to hand when showing where pain is located ?  ?A/PROM Right ?02/19/2021 Right  ?03/11/21 Right  ?2/24 R AROM ?04/04/22  ?Shoulder flexion 120  122 145 supine, 148 seated 170d  ?Shoulder extension      WFL  ?Shoulder abduction 80  144 155 170d  ?Shoulder adduction     Opp shoulder   ?Shoulder internal rotation     Midline L3 T12  ?Shoulder external rotation     C6 C7  ?Elbow flexion        ?Elbow extension        ?Wrist flexion        ?Wrist extension        ?Wrist ulnar deviation        ?Wrist radial deviation        ?Wrist pronation        ?Wrist supination        ?(Blank rows = not tested) ?  ?UPPER EXTREMITY MMT: measuring grossly at 3+/5 limitations by pain ?  ?MMT L ?02/19/2021 Right  ?03/13/21  ?Shoulder flexion    3+  ?  Shoulder extension   3+   ?Shoulder abduction    3+  ?Shoulder adduction      ?Shoulder internal rotation    3+  ?Shoulder external rotation    3+  ?Middle trapezius      ?Lower trapezius      ?Elbow flexion [facility] WFL   ?Elbow extension [facility] WFL   ?Wrist flexion    WFL   ?Wrist extension    WFL   ?Wrist ulnar deviation      ?Wrist radial deviation      ?Wrist pronation      ?Wrist supination      ?Grip strength (lbs)  31#  30#  ?(Blank rows = not tested) ?  ?  ?  ?  ?           ?TODAY'S TREATMENT:  ?[facility] Adult PT Treatment:                                                DATE: 04/20/21 ?Therapeutic Exercise: ?UBE 3 min fwd lvl 1.0 while taking subjective ?Supine cane chest press x 15 ?Supine cane flexion 15x ?Supine horizontal abd 2x10 RTB ?SL ER 2x10 R ?SL abduction 2x10 R ?Standing row 3x10 GTB ?Standing shoulder ext 3x10 RTB ?R shoulder IR/ER 2x10 YTB ?Wall pushups 15x ?Farmers carry R 5# x 115ft ?Seated bilateral ER 2x10 YTB ?Seated scaption 2x10 R ? ?[facility] Adult PT Treatment:                                                DATE: 04/13/21 ?Therapeutic Exercise: ?UBE x 2 min lvl 1.0 while taking subjective ?Supine cane  chest press x 15 ?Supine cane flexion 15x ?Supine horizontal abd 2x10 YTB ?SL ER 2x10 R ?SL abduction 2x10 R ?Prone ext 2x10 R ?Prone scaption 2x10 R ?Standing row 3x10 GTB ?Wall pushups 15x ?Seated bilateral ER 2x10 YTB ?Seated scaption 2x10 R ? ?[facility] Adult PT Treatment:                                                DATE: 04/10/21 ?Therapeutic Exercise: ?UBE 3/3 min, (unable to maintain speed to turn machine on) ?Supine press/protract 15x (all exercises performed holding non-weighted ball) ?Supine flexion 15x ?SL ER 15x ?SL abduction 15x ?Prone ext 15x ?Prone flexion 15x ?Prone scaption 15x ?Prone hor abd ER 15x ?Supine hor abduction YTB 15x ?Supine flexion 15/15 YTB ?Wall pushups 15x ?Ball roll flexion on wall 15x ?Seated scaption 15x ?Manual Therapy: ?D1 F/E 15x light manual resistance ? ?[facility] Adult PT Treatment:                                                DATE: 04/03/21 ?Therapeutic Exercise: ?Supine press/protract 10x ?Supine flexion 10x ?SL ER 10x ?SL abduction 10x ?Prone ext 10x ?Prone flexion 10x ?Prone scaption 10x ?Prone hor abd ER 10x ?Manual: ?PROM, MMT, skilled palpation of TPs and posterior shoulder musculature  ? ?  ?  PATIENT EDUCATION: ?Education details: Anatomy of condition, POC, HEP, exercise form/rationale ?  ?Person educated: Patient ?Education method: Explanation, Demonstration, Tactile cues, Verbal cues, and Handouts ?Education comprehension: verbalized understanding, returned demonstration, verbal cues required, tactile cues required, and needs further education ?  ?  ?HOME EXERCISE PROGRAM: ?Access Code: [email or web address] ?URL: [email or web address] ?Date: 04/03/2021 ?Prepared by: Sharlynn Oliphant ? ?Exercises ?Seated Scapular Retraction - 7 x weekly ?Seated Upper Trapezius Stretch - 2 x daily - 7 x weekly - 1 sets - 30s hold ?Standing Shoulder Scaption - 2 x daily - 7 x weekly - 3 sets - 10 reps - 30s hold ?Seated Shoulder Press Ups Off Table - 2 x daily - 7 x weekly - 1 sets - 10  reps ? ?  ?ASSESSMENT: ?  ?CLINICAL IMPRESSION:  ?Pt was able to complete all prescribed exercises with no adverse effect. Therapy today continued to focus on increasing periscapular and RTC strength in order to improve dynamic stability for R shoulder. Pt continues to benefit from skilled PT services and will continue to be seen and progressed as able.  ?  ?  ?Objective impairments:  ?decreased activity tolerance, decreased ROM, decreased strength, increased muscle spasms, impaired flexibility, impaired sensation, impaired UE functional use, and pain. These impairments are limiting patient from cleaning, community activity, driving, meal prep, occupation, laundry, and shopping. Personal factors including 1 comorbidity: seizures of unknown origin or triggers  are also affecting patient's functional outcome. Patient will benefit from skilled PT to address above impairments and improve overall function. ?  ? ?  ?  ?GOALS: ?Goals reviewed with patient? Yes ?  ?SHORT TERM GOALS: ?  ?STG Name Target Date Goal status  ?1 Will demo proper form with HEP as it has been established in the short term ?Baseline: will progress as appropriate 04/03/2021 ongoing  ?2 Decreased pain to average of 7/10 ?Baseline: cont to report 10/10 initially with improvements following session 03/12/2021 ongoing  ?LONG TERM GOALS:  ?  ?LTG Name Target Date Goal status  ?1 Pt will return to work with ability to complete job duties ?Baseline: unable to use arm to lift due to pain right now 05/21/21 ongoing  ?2 Able to sleep without waking due to pain ?Baseline: no change 05/21/21 ongoing  ?3 FOTO to 60 function ?Baseline: 45 at eval 05/21/21 ongoing  ?4 Pt will demo at least 4/5 strength throughout R shoulder ?Baseline: 04/03/21 Globally 3+/5 strength 05/21/21 ongoing  ?PLAN: ?PT FREQUENCY: 1x/week ?  ?PT DURATION: 6 weeks ?  ?PLANNED INTERVENTIONS: Therapeutic exercises, Therapeutic activity, Neuro Muscular re-education, Patient/Family education, Joint  mobilization, Aquatic Therapy, Dry Needling, Electrical stimulation, Spinal mobilization, Cryotherapy, Moist heat, Taping, Traction, Ionotophoresis 4mg /ml Dexamethasone, and Manual therapy ?  ?PLAN FOR NEXT SESSION: A

## 2021-04-29 ENCOUNTER — Ambulatory Visit: Payer: BLUE CROSS/BLUE SHIELD

## 2021-04-29 DIAGNOSIS — M25511 Pain in right shoulder: Secondary | ICD-10-CM

## 2021-04-29 DIAGNOSIS — M7501 Adhesive capsulitis of right shoulder: Secondary | ICD-10-CM

## 2021-04-29 DIAGNOSIS — M6281 Muscle weakness (generalized): Secondary | ICD-10-CM

## 2021-04-29 NOTE — Therapy (Addendum)
OUTPATIENT PHYSICAL THERAPY TREATMENT NOTE/RE-EVAL/DC SUMMARY   Patient Name: Shari Gonzales MRN: [id number] DOB:October 18, 1960, 61 y.o., female Today's Date: 04/29/2021  PCP: Teena Dunk, NP REFERRING PROVIDER: Vanetta Mulders, MD PHYSICAL THERAPY DISCHARGE SUMMARY  Visits from Start of Care: 9  Current functional level related to goals / functional outcomes: Goals partially met   Remaining deficits: pain   Education / Equipment: HEP   Patient agrees to discharge. Patient goals were partially met. Patient is being discharged due to  need of surgical correction.   PT End of Session - 04/29/21 1048     Visit Number 9    Number of Visits 17    Date for PT Re-Evaluation 04/17/21    Authorization Type BCBS    PT Start Time 1050    PT Stop Time 1130    PT Time Calculation (min) 40 min    Activity Tolerance Patient tolerated treatment well    Behavior During Therapy WFL for tasks assessed/performed                Past Medical History:  Diagnosis Date   Hyperkalemia    Hyperlipidemia    Hypertension    Seizures ([place])    Vitamin D deficiency    History reviewed. No pertinent surgical history. Patient Active Problem List   Diagnosis Date Noted   Dental caries 03/07/2018   HTN (hypertension) 07/12/2016   Hot flashes 07/12/2016   Concussion with loss of consciousness 05/14/2015    REFERRING DIAG: S43.004A (ICD-10-CM) - Dislocation of right shoulder joint, initial encounter  THERAPY DIAG: R shoulder adhesive capsulitis    PERTINENT HISTORY: Seizures, vit D deficiency   PRECAUTIONS: None  SUBJECTIVE: Continues to report 10/10 pain, does not feel shoulder function has improved. Continues to cite generalized discomfort across shoulder globally versus localized.   PAIN:  Are you having pain? Yes NPRS scale: 8/10 Pain location: Rt UE Pain description:  throbbing   Aggravating factors: constatnt pain Relieving factors: exercises feel  good   OBJECTIVE:    PATIENT SURVEYS:  FOTO 45   COGNITION:          Overall cognitive status: Within functional limits for tasks assessed                               SENSATION:          Reports some numbness and shooting pain   POSTURE: Rt shoulder elevation at rest   PALPATION: Gross TTP   UPPER EXTREMITY AROM/PROM:  PROM is full with stretchy end feel, pain reported into arm rather than at shoulder; all AROM pain reported- rubs from shoulder to hand when showing where pain is located   A/PROM Right 02/19/2021 Right  03/11/21 Right  2/24 R AROM 04/04/22  Shoulder flexion 120  122 145 supine, 148 seated 170d  Shoulder extension      WFL  Shoulder abduction 80  144 155 170d  Shoulder adduction     Opp shoulder   Shoulder internal rotation     Midline L3 T12  Shoulder external rotation     C6 C7  Elbow flexion        Elbow extension        Wrist flexion        Wrist extension        Wrist ulnar deviation        Wrist radial deviation  Wrist pronation        Wrist supination        (Blank rows = not tested)   UPPER EXTREMITY MMT: measuring grossly at 3+/5 limitations by pain   MMT L 02/19/2021 Right  03/13/21  Shoulder flexion    3+  Shoulder extension   3+   Shoulder abduction    3+  Shoulder adduction      Shoulder internal rotation    3+  Shoulder external rotation    3+  Middle trapezius      Lower trapezius      Elbow flexion [facility] WFL   Elbow extension [facility] WFL   Wrist flexion    WFL   Wrist extension    WFL   Wrist ulnar deviation      Wrist radial deviation      Wrist pronation      Wrist supination      Grip strength (lbs)  31#  30#  (Blank rows = not tested)                    TODAY'S TREATMENT:  [facility] Adult PT Treatment:                                                DATE: 04/29/21 Therapeutic Exercise: UBE 3/3 L1 Supine chest press/protraction 1# x15 Supine flexion 1# 15x Supine horizontal abd 2x10 RTB SL ER 1# R 15x SL abduction R  1# 15x Prone extension 1# R  Prone flexion 1# 15x Prone hor abd 1# IR/ER 10/10 Prone scaption 1# 15x PNF D1 F/E 15x Supine hor abd YTB 15x B Supine hor abd YTB 15/15 alt. Supine alt. Flexion YTB 15/15 Rhythmic stabilization at 90d flexion 30s x3 Standing wall wash 15x into flexion  [facility] Adult PT Treatment:                                                DATE: 04/20/21 Therapeutic Exercise: UBE 3 min fwd lvl 1.0 while taking subjective Supine cane chest press x 15 Supine cane flexion 15x Supine horizontal abd 2x10 RTB SL ER 2x10 R SL abduction 2x10 R Standing row 3x10 GTB Standing shoulder ext 3x10 RTB R shoulder IR/ER 2x10 YTB Wall pushups 15x Farmers carry R 5# x 126f Seated bilateral ER 2x10 YTB Seated scaption 2x10 R  [facility] Adult PT Treatment:                                                DATE: 04/13/21 Therapeutic Exercise: UBE x 2 min lvl 1.0 while taking subjective Supine cane chest press x 15 Supine cane flexion 15x Supine horizontal abd 2x10 YTB SL ER 2x10 R SL abduction 2x10 R Prone ext 2x10 R Prone scaption 2x10 R Standing row 3x10 GTB Wall pushups 15x Seated bilateral ER 2x10 YTB Seated scaption 2x10 R   Manual: PROM, MMT, skilled palpation of TPs and posterior shoulder musculature     PATIENT EDUCATION: Education details: Anatomy of condition, POC, HEP, exercise form/rationale   Person educated: Patient Education method: Explanation, Demonstration,  Tactile cues, Verbal cues, and Handouts Education comprehension: verbalized understanding, returned demonstration, verbal cues required, tactile cues required, and needs further education     HOME EXERCISE PROGRAM: Access Code: [email or web address] URL: [email or web address] Date: 04/03/2021 Prepared by: Sharlynn Oliphant  Exercises Seated Scapular Retraction - 7 x weekly Seated Upper Trapezius Stretch - 2 x daily - 7 x weekly - 1 sets - 30s hold Standing Shoulder Scaption - 2 x daily - 7 x weekly -  3 sets - 10 reps - 30s hold Seated Shoulder Press Ups Off Table - 2 x daily - 7 x weekly - 1 sets - 10 reps    ASSESSMENT:   CLINICAL IMPRESSION: Continued high levels of pain which do not correlate with objective findings of ROM, strength and observed function.  Advanced strengthening tasks to challenge rotator cuff and posterior shoulder musculature.  Negative signs of impingement or RC weakness.  Patient to f/u with ortho on 4/17      Objective impairments:  decreased activity tolerance, decreased ROM, decreased strength, increased muscle spasms, impaired flexibility, impaired sensation, impaired UE functional use, and pain. These impairments are limiting patient from cleaning, community activity, driving, meal prep, occupation, laundry, and shopping. Personal factors including 1 comorbidity: seizures of unknown origin or triggers  are also affecting patient's functional outcome. Patient will benefit from skilled PT to address above impairments and improve overall function.        GOALS: Goals reviewed with patient? Yes   SHORT TERM GOALS:   STG Name Target Date Goal status  1 Will demo proper form with HEP as it has been established in the short term Baseline: will progress as appropriate 04/03/2021 ongoing  2 Decreased pain to average of 7/10 Baseline: cont to report 10/10 initially with improvements following session 03/12/2021 ongoing  LONG TERM GOALS:    LTG Name Target Date Goal status  1 Pt will return to work with ability to complete job duties Baseline: unable to use arm to lift due to pain right now 05/21/21 ongoing  2 Able to sleep without waking due to pain Baseline: no change 05/21/21 ongoing  3 FOTO to 60 function Baseline: 45 at eval 05/21/21 ongoing  4 Pt will demo at least 4/5 strength throughout R shoulder Baseline: 04/03/21 Globally 3+/5 strength 05/21/21 ongoing  PLAN: PT FREQUENCY: 1x/week   PT DURATION: 6 weeks   PLANNED INTERVENTIONS: Therapeutic exercises,  Therapeutic activity, Neuro Muscular re-education, Patient/Family education, Joint mobilization, Aquatic Therapy, Dry Needling, Electrical stimulation, Spinal mobilization, Cryotherapy, Moist heat, Taping, Traction, Ionotophoresis 70m/ml Dexamethasone, and Manual therapy   PLAN FOR NEXT SESSION: Patient to follow up with MD on 4/17 to determine further course of treatment for continued R shoulder pain    JLeroy SeaPT  04/29/21 10:50 AM

## 2021-05-04 ENCOUNTER — Ambulatory Visit (HOSPITAL_BASED_OUTPATIENT_CLINIC_OR_DEPARTMENT_OTHER): Payer: Self-pay | Admitting: Orthopaedic Surgery

## 2021-05-04 ENCOUNTER — Other Ambulatory Visit (HOSPITAL_BASED_OUTPATIENT_CLINIC_OR_DEPARTMENT_OTHER): Payer: Self-pay

## 2021-05-04 ENCOUNTER — Ambulatory Visit (INDEPENDENT_AMBULATORY_CARE_PROVIDER_SITE_OTHER): Payer: BLUE CROSS/BLUE SHIELD | Admitting: Orthopaedic Surgery

## 2021-05-04 DIAGNOSIS — M25511 Pain in right shoulder: Secondary | ICD-10-CM

## 2021-05-04 DIAGNOSIS — S46011A Strain of muscle(s) and tendon(s) of the rotator cuff of right shoulder, initial encounter: Secondary | ICD-10-CM

## 2021-05-04 MED ORDER — IBUPROFEN 800 MG PO TABS
800.0000 mg | ORAL_TABLET | Freq: Three times a day (TID) | ORAL | 0 refills | Status: AC
  Filled 2021-05-04: qty 30, 10d supply, fill #0

## 2021-05-04 MED ORDER — OXYCODONE HCL 5 MG PO TABS
5.0000 mg | ORAL_TABLET | ORAL | 0 refills | Status: DC | PRN
  Filled 2021-05-04: qty 20, 4d supply, fill #0

## 2021-05-04 MED ORDER — ACETAMINOPHEN 500 MG PO TABS
500.0000 mg | ORAL_TABLET | Freq: Three times a day (TID) | ORAL | 0 refills | Status: AC
  Filled 2021-05-04: qty 30, 10d supply, fill #0

## 2021-05-04 MED ORDER — ASPIRIN EC 325 MG PO TBEC
325.0000 mg | DELAYED_RELEASE_TABLET | Freq: Every day | ORAL | 0 refills | Status: DC
  Filled 2021-05-04: qty 30, 30d supply, fill #0

## 2021-05-04 NOTE — Progress Notes (Signed)
? ?                            ? ? ?Chief Complaint: right shoulder pain, dislocation ?  ? ? ?History of Present Illness:  ? ?05/04/2021: Shari Gonzales presents today for follow-up of her right shoulder.  She has been working with physical therapy now for 2 months following her shoulder dislocation in December of last year.  She seems to have plateaued at this point.  While she is making gains in terms of strength she does have persistent pain in the right shoulder.  She states that she got minimal relief from her injection in mid March.  She is here today for further assessment.  She has somewhat plateaued with physical therapy and is still in a significant amount of pain.  She is here today seeking further advice. ? ?Shari Gonzales is a 61 y.o. female dominant female presents after right shoulder dislocation that occurred on January 03, 2021.  She had to go to the emergency room to have this relocated.  This dislocated when she was pulling a heavy load of laundry at work.  Denies any previous issues of instability or dislocation.  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  At this time she is here for further evaluation ? ? ? ?Surgical History:   ?None ? ?PMH/PSH/Family History/Social History/Meds/Allergies:   ? ?Past Medical History:  ?Diagnosis Date  ? Hyperkalemia   ? Hyperlipidemia   ? Hypertension   ? Seizures (HCC)   ? Vitamin D deficiency   ? ?No past surgical history on file. ?Social History  ? ?Socioeconomic History  ? Marital status: Single  ?  Spouse name: Not on file  ? Number of children: Not on file  ? Years of education: Not on file  ? Highest education level: Not on file  ?Occupational History  ? Not on file  ?Tobacco Use  ? Smoking status: Never  ? Smokeless tobacco: Never  ?Vaping Use  ? Vaping Use: Never used  ?Substance and Sexual Activity  ? Alcohol use: No  ?  Alcohol/week: 0.0 standard  drinks  ? Drug use: Not Currently  ? Sexual activity: Yes  ?  Birth control/protection: Post-menopausal  ?Other Topics Concern  ? Not on file  ?Social History Narrative  ? Not on file  ? ?Social Determinants of Health  ? ?Financial Resource Strain: Not on file  ?Food Insecurity: Not on file  ?Transportation Needs: Not on file  ?Physical Activity: Not on file  ?Stress: Not on file  ?Social Connections: Not on file  ? ?Family History  ?Family history unknown: Yes  ? ?No Known Allergies ?Current Outpatient Medications  ?Medication Sig Dispense Refill  ? acetaminophen (TYLENOL) 500 MG tablet Take 1 tablet (500 mg total) by mouth every 8 (eight) hours for 10 days. 30 tablet 0  ? aspirin EC 325 MG tablet Take 1 tablet (325 mg total) by mouth daily. 30 tablet 0  ? ibuprofen (ADVIL) 800 MG tablet Take 1 tablet (800 mg total) by mouth every 8 (eight) hours for 10 days. Please take with food, please alternate with acetaminophen 30 tablet 0  ? oxyCODONE (OXY IR/ROXICODONE) 5 MG immediate release tablet Take 1 tablet (5 mg total) by mouth every 4 (four) hours as needed (severe pain). 20 tablet 0  ?  methocarbamol (ROBAXIN) 500 MG tablet Take 1 tablet (500 mg total) by mouth 2 (two) times daily. 20 tablet 0  ? ?No current facility-administered medications for this visit.  ? ?No results found. ? ?Review of Systems:   ?A ROS was performed including pertinent positives and negatives as documented in the HPI. ? ?Physical Exam :   ?Constitutional: NAD and appears stated age ?Neurological: Alert and oriented ?Psych: Appropriate affect and cooperative ?There were no vitals taken for this visit.  ? ?Comprehensive Musculoskeletal Exam:   ? ?Musculoskeletal Exam    ?Inspection Right Left  ?Skin No atrophy or winging No atrophy or winging  ?Palpation    ?Tenderness Lateral deltoid, bicipital groove none  ?Range of Motion    ?Flexion (passive) 170 170  ?Flexion (active) 170 170  ?Abduction 170 170  ?ER at the side 65 70  ?Can reach behind  back to T12 T12  ?Strength    ? 4/5 with pain 5 out of 5  ?Special Tests    ?Pseudoparalytic No No  ?Neurologic    ?Fires PIN, radial, median, ulnar, musculocutaneous, axillary, suprascapular, long thoracic, and spinal accessory innervated muscles. No abnormal sensibility  ?Vascular/Lymphatic    ?Radial Pulse 2+ 2+  ?Cervical Exam    ?Patient has symmetric cervical range of motion with negative Spurling's test.  ?Special Test: Positive Spurling's test, positive speeds test with tenderness over the bicipital groove  ? ? ? ?Imaging:   ?Xray (3 views right shoulder): ?Concentrically reduced right shoulder ? ?MRI right shoulder: ?She has overall a very minor partial rotator cuff tear, this involves approximately 50% of the articular footprint of the supraspinatus.  There appears to be some tearing of the anterior labrum.  Glenohumeral cartilage is preserved.  ? ? ?I personally reviewed and interpreted the radiographs. ? ? ?Assessment:   ?61 year old female status post right shoulder dislocation which was subsequently reduced in the emergency room we did discuss that with a shoulder dislocation.  I did discuss that as a result of this she has a small partial tear of the articular side of the rotator cuff.  This is continued to be very symptomatic for her.  She is working with physical therapy now for several months but is somewhat plateaued with some persistent weakness.  She is still having pain at night and inability to lay on her side at night.  This is causing her significant distress.  She has not been able to return to her work at [employer] as she has pain and dysfunction of the shoulder.  I discussed treatment options with Areya.  Overall I was hopeful that she would be able to restore her function with physical therapy as her tear is somewhat minor.  That being said she has persistent pain and inability to sleep.  I did discuss treatment options including surgical versus nonsurgical.  We did discuss the  possibility of continued rehab and possibly another injection in order to optimize her ability to do this.  She would like to defer this given the fact that she did not get any relief from the first injection.  At this time I have also discussed surgical options.  She does have approximately 50% of her rotator cuff involved with regard to her partial tear.  I did discuss that this would be a very possible threshold for rotator cuff repair and that we would ultimately plan on doing this with further corroboration of intraoperative findings.  She is significantly tender about her biceps tendon  so I would plan for a biceps tenodesis at the time of that procedure as well.  Given the fact that she has somewhat plateaued with physical therapy she has opted for surgical intervention ? ?Plan :   ? ?-Plan for right shoulder arthroscopy with rotator cuff repair, acromioplasty, biceps tenodesis ? ? ? ?After a lengthy discussion of treatment options, including risks, benefits, alternatives, complications of surgical and nonsurgical conservative options, the patient elected surgical repair.  ? ?The patient  is aware of the material risks  and complications including, but not limited to injury to adjacent structures, neurovascular injury, infection, numbness, bleeding, implant failure, thermal burns, stiffness, persistent pain, failure to heal, disease transmission from allograft, need for further surgery, dislocation, anesthetic risks, blood clots, risks of death,and others. The probabilities of surgical success and failure discussed with patient given their particular co-morbidities.The time and nature of expected rehabilitation and recovery was discussed.The patient's questions were all answered preoperatively.  No barriers to understanding were noted. ?I explained the natural history of the disease process and Rx rationale.  I explained to the patient what I considered to be reasonable expectations given their personal  situation.  The final treatment plan was arrived at through a shared patient decision making process model. ? ?Patient was prescribed a shoulder immobilizer today for the diagnosis listed above under assessment. Lowella Dandy

## 2021-05-21 ENCOUNTER — Telehealth: Payer: Self-pay

## 2021-05-25 NOTE — Telephone Encounter (Signed)
No additional notes needed  

## 2021-06-09 ENCOUNTER — Ambulatory Visit: Payer: BLUE CROSS/BLUE SHIELD

## 2021-07-30 ENCOUNTER — Ambulatory Visit (HOSPITAL_BASED_OUTPATIENT_CLINIC_OR_DEPARTMENT_OTHER): Payer: BLUE CROSS/BLUE SHIELD | Attending: Orthopaedic Surgery | Admitting: Physical Therapy

## 2021-08-03 ENCOUNTER — Telehealth: Payer: Self-pay | Admitting: Orthopaedic Surgery

## 2021-08-03 NOTE — Telephone Encounter (Signed)
Pt's friend Fayrene Fearing called concerning [facility] physical therapy appt. Pt has some confusion and need to clear so info up. Also need to reschedule [facility] appt. Please call pt's friend Fayrene Fearing at [phone].

## 2021-08-06 NOTE — Therapy (Signed)
  OUTPATIENT PHYSICAL THERAPY SHOULDER    Patient Name: Shari Gonzales MRN: [id number] DOB:August 26, 1960, 61 y.o., female Today's Date: 08/06/2021    Past Medical History:  Diagnosis Date   Hyperkalemia    Hyperlipidemia    Hypertension    Seizures ([facility])    Vitamin D deficiency    No past surgical history on file. Patient Active Problem List   Diagnosis Date Noted   Dental caries 03/07/2018   HTN (hypertension) 07/12/2016   Hot flashes 07/12/2016   Concussion with loss of consciousness 05/14/2015    Pt had a dislocation that occurred on January 03, 2021 when pulling a heavy load of laundry at work. She had to go to the emergency room to have this relocated.  Pt received PT from Feb - April.  Pt reports some improvement though she continued to have pain.  MD note indicated she had somewhat plateaued with physical therapy.  MD recommended surgery and PT order is for post-operatively.  PT spoke with pt today as she came in for a PT evaluation.  PT then spoke with MD's ATC.  It seems that pt was going to be scheduled for surgery, but scheduling was delayed due to insurance issues involving if pt had workman's comp or not.  PT relayed information from ATC to pt including a phone number they could call to understand more about the insurance issue and why surgery was delayed.  Pt informed PT that [employer] and workman's comp are discussing the proper coverage for pt in early to mid August.   PT did not perform evaluation today due to PT order being for post-op and pt already receiving prior PT treatment.  Pt was instructed to call us back with any questions they may have.    Audie Clear III PT, DPT 08/07/21 5:03 PM

## 2021-08-07 ENCOUNTER — Encounter (HOSPITAL_BASED_OUTPATIENT_CLINIC_OR_DEPARTMENT_OTHER): Payer: Self-pay

## 2021-08-07 ENCOUNTER — Ambulatory Visit (HOSPITAL_BASED_OUTPATIENT_CLINIC_OR_DEPARTMENT_OTHER): Payer: BLUE CROSS/BLUE SHIELD | Admitting: Physical Therapy

## 2021-08-07 DIAGNOSIS — M25511 Pain in right shoulder: Secondary | ICD-10-CM

## 2022-06-24 DIAGNOSIS — Z1339 Encounter for screening examination for other mental health and behavioral disorders: Secondary | ICD-10-CM | POA: Diagnosis not present

## 2022-06-24 DIAGNOSIS — Z1231 Encounter for screening mammogram for malignant neoplasm of breast: Secondary | ICD-10-CM | POA: Diagnosis not present

## 2022-06-24 DIAGNOSIS — Z7982 Long term (current) use of aspirin: Secondary | ICD-10-CM | POA: Diagnosis not present

## 2022-06-24 DIAGNOSIS — G8929 Other chronic pain: Secondary | ICD-10-CM | POA: Diagnosis not present

## 2022-06-24 DIAGNOSIS — N951 Menopausal and female climacteric states: Secondary | ICD-10-CM | POA: Diagnosis not present

## 2022-06-24 DIAGNOSIS — Z1211 Encounter for screening for malignant neoplasm of colon: Secondary | ICD-10-CM | POA: Diagnosis not present

## 2022-06-24 DIAGNOSIS — M25512 Pain in left shoulder: Secondary | ICD-10-CM | POA: Diagnosis not present

## 2022-06-24 DIAGNOSIS — Z7689 Persons encountering health services in other specified circumstances: Secondary | ICD-10-CM | POA: Diagnosis not present

## 2022-06-24 DIAGNOSIS — Z1159 Encounter for screening for other viral diseases: Secondary | ICD-10-CM | POA: Diagnosis not present

## 2022-06-24 DIAGNOSIS — I10 Essential (primary) hypertension: Secondary | ICD-10-CM | POA: Diagnosis not present

## 2022-06-24 DIAGNOSIS — I739 Peripheral vascular disease, unspecified: Secondary | ICD-10-CM | POA: Diagnosis not present

## 2022-06-24 DIAGNOSIS — Z131 Encounter for screening for diabetes mellitus: Secondary | ICD-10-CM | POA: Diagnosis not present

## 2022-06-24 DIAGNOSIS — Z1383 Encounter for screening for respiratory disorder NEC: Secondary | ICD-10-CM | POA: Diagnosis not present

## 2022-06-24 DIAGNOSIS — Z13220 Encounter for screening for lipoid disorders: Secondary | ICD-10-CM | POA: Diagnosis not present

## 2022-06-24 DIAGNOSIS — Z136 Encounter for screening for cardiovascular disorders: Secondary | ICD-10-CM | POA: Diagnosis not present

## 2022-06-24 DIAGNOSIS — Z122 Encounter for screening for malignant neoplasm of respiratory organs: Secondary | ICD-10-CM | POA: Diagnosis not present

## 2022-06-24 DIAGNOSIS — F17210 Nicotine dependence, cigarettes, uncomplicated: Secondary | ICD-10-CM | POA: Diagnosis not present

## 2022-06-24 DIAGNOSIS — F320 Major depressive disorder, single episode, mild: Secondary | ICD-10-CM | POA: Diagnosis not present

## 2022-07-01 DIAGNOSIS — I10 Essential (primary) hypertension: Secondary | ICD-10-CM | POA: Diagnosis not present

## 2022-07-08 DIAGNOSIS — F320 Major depressive disorder, single episode, mild: Secondary | ICD-10-CM | POA: Diagnosis not present

## 2022-07-08 DIAGNOSIS — I739 Peripheral vascular disease, unspecified: Secondary | ICD-10-CM | POA: Diagnosis not present

## 2022-07-08 DIAGNOSIS — I10 Essential (primary) hypertension: Secondary | ICD-10-CM | POA: Diagnosis not present

## 2022-07-08 DIAGNOSIS — Z1383 Encounter for screening for respiratory disorder NEC: Secondary | ICD-10-CM | POA: Diagnosis not present

## 2022-07-08 DIAGNOSIS — F17210 Nicotine dependence, cigarettes, uncomplicated: Secondary | ICD-10-CM | POA: Diagnosis not present

## 2022-07-08 DIAGNOSIS — Z1339 Encounter for screening examination for other mental health and behavioral disorders: Secondary | ICD-10-CM | POA: Diagnosis not present

## 2022-07-08 DIAGNOSIS — R413 Other amnesia: Secondary | ICD-10-CM | POA: Diagnosis not present

## 2022-07-08 DIAGNOSIS — N951 Menopausal and female climacteric states: Secondary | ICD-10-CM | POA: Diagnosis not present

## 2022-08-04 DIAGNOSIS — I10 Essential (primary) hypertension: Secondary | ICD-10-CM | POA: Diagnosis not present

## 2022-08-04 DIAGNOSIS — G8929 Other chronic pain: Secondary | ICD-10-CM | POA: Diagnosis not present

## 2022-08-04 DIAGNOSIS — F325 Major depressive disorder, single episode, in full remission: Secondary | ICD-10-CM | POA: Diagnosis not present

## 2022-08-04 DIAGNOSIS — I739 Peripheral vascular disease, unspecified: Secondary | ICD-10-CM | POA: Diagnosis not present

## 2022-08-04 DIAGNOSIS — Z87891 Personal history of nicotine dependence: Secondary | ICD-10-CM | POA: Diagnosis not present

## 2022-08-24 DIAGNOSIS — I739 Peripheral vascular disease, unspecified: Secondary | ICD-10-CM | POA: Diagnosis not present

## 2022-08-24 DIAGNOSIS — I10 Essential (primary) hypertension: Secondary | ICD-10-CM | POA: Diagnosis not present

## 2022-08-24 DIAGNOSIS — G3184 Mild cognitive impairment, so stated: Secondary | ICD-10-CM | POA: Diagnosis not present

## 2022-08-24 DIAGNOSIS — F320 Major depressive disorder, single episode, mild: Secondary | ICD-10-CM | POA: Diagnosis not present

## 2022-08-24 DIAGNOSIS — Z1383 Encounter for screening for respiratory disorder NEC: Secondary | ICD-10-CM | POA: Diagnosis not present

## 2022-08-24 DIAGNOSIS — Z23 Encounter for immunization: Secondary | ICD-10-CM | POA: Diagnosis not present

## 2022-08-24 DIAGNOSIS — R748 Abnormal levels of other serum enzymes: Secondary | ICD-10-CM | POA: Diagnosis not present

## 2022-08-24 DIAGNOSIS — Z124 Encounter for screening for malignant neoplasm of cervix: Secondary | ICD-10-CM | POA: Diagnosis not present

## 2022-08-24 DIAGNOSIS — N951 Menopausal and female climacteric states: Secondary | ICD-10-CM | POA: Diagnosis not present

## 2022-08-24 DIAGNOSIS — F17210 Nicotine dependence, cigarettes, uncomplicated: Secondary | ICD-10-CM | POA: Diagnosis not present

## 2022-08-24 DIAGNOSIS — R87610 Atypical squamous cells of undetermined significance on cytologic smear of cervix (ASC-US): Secondary | ICD-10-CM | POA: Diagnosis not present

## 2022-08-24 DIAGNOSIS — Z1339 Encounter for screening examination for other mental health and behavioral disorders: Secondary | ICD-10-CM | POA: Diagnosis not present

## 2022-08-24 DIAGNOSIS — R413 Other amnesia: Secondary | ICD-10-CM | POA: Diagnosis not present

## 2023-02-09 ENCOUNTER — Emergency Department (HOSPITAL_COMMUNITY): Payer: 59

## 2023-02-09 ENCOUNTER — Other Ambulatory Visit: Payer: Self-pay

## 2023-02-09 ENCOUNTER — Encounter (HOSPITAL_COMMUNITY): Payer: Self-pay | Admitting: Emergency Medicine

## 2023-02-09 ENCOUNTER — Emergency Department (HOSPITAL_COMMUNITY)
Admission: EM | Admit: 2023-02-09 | Discharge: 2023-02-09 | Disposition: A | Discharge status: Home / Self Care | Payer: 59 | Attending: Emergency Medicine | Admitting: Emergency Medicine

## 2023-02-09 DIAGNOSIS — Z7982 Long term (current) use of aspirin: Secondary | ICD-10-CM | POA: Insufficient documentation

## 2023-02-09 DIAGNOSIS — S52502A Unspecified fracture of the lower end of left radius, initial encounter for closed fracture: Secondary | ICD-10-CM | POA: Insufficient documentation

## 2023-02-09 DIAGNOSIS — W010XXA Fall on same level from slipping, tripping and stumbling without subsequent striking against object, initial encounter: Secondary | ICD-10-CM | POA: Insufficient documentation

## 2023-02-09 DIAGNOSIS — S52612A Displaced fracture of left ulna styloid process, initial encounter for closed fracture: Secondary | ICD-10-CM | POA: Insufficient documentation

## 2023-02-09 DIAGNOSIS — S6992XA Unspecified injury of left wrist, hand and finger(s), initial encounter: Secondary | ICD-10-CM | POA: Diagnosis not present

## 2023-02-09 DIAGNOSIS — S59202A Unspecified physeal fracture of lower end of radius, left arm, initial encounter for closed fracture: Secondary | ICD-10-CM | POA: Diagnosis not present

## 2023-02-09 MED ORDER — OXYCODONE-ACETAMINOPHEN 5-325 MG PO TABS
1.0000 | ORAL_TABLET | Freq: Once | ORAL | Status: AC
  Administered 2023-02-09: 1 via ORAL
  Filled 2023-02-09: qty 1

## 2023-02-09 MED ORDER — OXYCODONE-ACETAMINOPHEN 5-325 MG PO TABS: 1.0000 | ORAL_TABLET | Freq: Four times a day (QID) | ORAL | 0 refills | Status: DC | PRN

## 2023-02-09 NOTE — ED Triage Notes (Signed)
Patient report slip and fell on her left arm. Pt denies LOC. Patient denies taking blood thinners. Patient endorse 10/10 pain on her left arms.

## 2023-02-09 NOTE — ED Provider Notes (Signed)
[place] EMERGENCY DEPARTMENT AT [facility] Provider Note   CSN: [id number] Arrival date & time: 02/09/23  0133     History  Chief Complaint  Patient presents with   Shari Gonzales    Shari Gonzales is a 63 y.o. female.  The history is provided by the patient.  Fall  Shari Gonzales is a 63 y.o. female who presents to the Emergency Department complaining of fall with wrist injury.  She presents to the emergency department for evaluation of injuries after a slip and fall when she was walking into a motel.  She states that she put out her left arm to protect her fall and complains of pain to her left wrist and hand.  No head injury or loss of consciousness.  This occurred around 8 PM.  She is right-hand dominant.  She has a history of hypertension, no additional medical problems. She did drink a small amount of alcohol today.  She occasionally drinks.    Home Medications Prior to Admission medications   Medication Sig Start Date End Date Taking? Authorizing Provider  oxyCODONE-acetaminophen (PERCOCET/ROXICET) 5-325 MG tablet Take 1 tablet by mouth every 6 (six) hours as needed for severe pain (pain score 7-10). 02/09/23  Yes Tilden Fossa, MD  aspirin EC 325 MG tablet Take 1 tablet (325 mg total) by mouth daily. 05/04/21   Huel Cote, MD  methocarbamol (ROBAXIN) 500 MG tablet Take 1 tablet (500 mg total) by mouth 2 (two) times daily. 04/14/21   Hyman Hopes, Margaux, PA-C  oxyCODONE (OXY IR/ROXICODONE) 5 MG immediate release tablet Take 1 tablet (5 mg total) by mouth every 4 (four) hours as needed (severe pain). 05/04/21   Huel Cote, MD      Allergies    Patient has no known allergies.    Review of Systems   Review of Systems  All other systems reviewed and are negative.   Physical Exam Updated Vital Signs BP (!) 137/98 (BP Location: Right Arm)   Pulse 91   Temp 97.9 F (36.6 C) (Oral)   Resp 16   SpO2 100%  Physical Exam Vitals and nursing note reviewed.   Constitutional:      Appearance: She is well-developed.  HENT:     Head: Normocephalic and atraumatic.  Cardiovascular:     Rate and Rhythm: Normal rate and regular rhythm.  Pulmonary:     Effort: Pulmonary effort is normal. No respiratory distress.  Musculoskeletal:     Comments: Soft tissue swelling, tenderness and deformity over the left wrist.  2+ radial pulse.  Fingers are warm and well-perfused.  There are abrasions that are hemostatic over the dorsal left third and fourth digits and a small abrasion over the palmar surface of the left hand.  She is able to wiggle all digits with sensation light touch intact in all digits. No tenderness over the elbow.  She is able to flex and extend at the elbow.  Skin:    General: Skin is warm and dry.  Neurological:     Mental Status: She is alert and oriented to person, place, and time.  Psychiatric:        Behavior: Behavior normal.     ED Results / Procedures / Treatments   Labs (all labs ordered are listed, but only abnormal results are displayed) Labs Reviewed - No data to display  EKG None  Radiology DG Hand Complete Left Result Date: 02/09/2023 CLINICAL DATA:  Larey Seat on ice this morning.  Wrist pain. EXAM: LEFT HAND -  COMPLETE 3+ VIEW COMPARISON:  Forearm radiographs 02/09/2023 at 1:54 a.m. FINDINGS: Redemonstrated acute impacted dorsally displaced and angulated distal left radial metaphysis fracture. Mildly displaced ulnar styloid fracture. No acute fracture in the hand. IMPRESSION: 1. Unchanged acute fracture of the distal radius. 2. Unchanged ulnar styloid fracture. Electronically Signed   By: Minerva Fester M.D.   On: 02/09/2023 03:08   DG Forearm Left Result Date: 02/09/2023 CLINICAL DATA:  Fall EXAM: LEFT FOREARM - 2 VIEW COMPARISON:  None Available. FINDINGS: There is an acute, impacted transverse fracture of the distal left radial metaphyseal region with 1/2 shaft with dorsal displacement, dorsal angulation, and mild override  of the distal fracture fragment with resultant ulnar positive variance. There is dorsal tilt of the distal radial articular surface. No dislocation. Associated mildly displaced fracture of the base of the ulnar styloid. Extensive surrounding soft tissue swelling. IMPRESSION: 1. Acute, impacted, dorsally displaced and angulated distal left radial metaphyseal fracture. 2. Associated mildly displaced ulnar styloid fracture. Electronically Signed   By: Helyn Numbers M.D.   On: 02/09/2023 02:11    Procedures Procedures   SPLINT APPLICATION Date/Time: 7:37 AM Authorized by: Tilden Fossa Consent: Verbal consent obtained. Risks and benefits: risks, benefits and alternatives were discussed Consent given by: patient Splint applied by: Madilyn Hook Location details: LUE Splint type: sugar tong Supplies used: orthoglass, acewrap Post-procedure: The splinted body part was neurovascularly unchanged following the procedure. Patient tolerance: Patient tolerated the procedure well with no immediate complications.   Medications Ordered in ED Medications  oxyCODONE-acetaminophen (PERCOCET/ROXICET) 5-325 MG per tablet 1 tablet (1 tablet Oral Given 02/09/23 0316)    ED Course/ Medical Decision Making/ A&P                                 Medical Decision Making Amount and/or Complexity of Data Reviewed Radiology: ordered.  Risk Prescription drug management.   Patient here following a slip and fall.  She has a distal radius fracture on imaging, correlates with her area of tenderness.  No evidence of hand fracture.  This is a closed injury.  No clinical evidence of elbow fracture.  No additional injuries.  Patient placed in splint.  Discussed orthopedics follow-up.  Will prescribe pain medications as needed.  Discussed return precautions.        Final Clinical Impression(s) / ED Diagnoses Final diagnoses:  Closed fracture of distal end of left radius, unspecified fracture morphology, initial  encounter    Rx / DC Orders ED Discharge Orders          Ordered    oxyCODONE-acetaminophen (PERCOCET/ROXICET) 5-325 MG tablet  Every 6 hours PRN        02/09/23 4696              Tilden Fossa, MD 02/09/23 [phone]

## 2023-02-09 NOTE — Progress Notes (Signed)
Orthopedic Tech Progress Note Patient Details:  Shari Gonzales 09/13/1960 [id number]  Patient ID: Shari Gonzales, female   DOB: 1960/04/26, 63 y.o.   MRN: [id number] Ortho techs not covering both campuses from 7pm-7am on 1/21-1/22 due to weather. Charge RN made aware at the beginning of night shift on 1/21. RN for this patient also informed when they called for splint placement.  Darleen Crocker 02/09/2023, 5:07 AM

## 2023-02-15 DIAGNOSIS — S52502A Unspecified fracture of the lower end of left radius, initial encounter for closed fracture: Secondary | ICD-10-CM | POA: Diagnosis not present

## 2023-02-23 ENCOUNTER — Encounter (HOSPITAL_BASED_OUTPATIENT_CLINIC_OR_DEPARTMENT_OTHER): Payer: Self-pay | Admitting: Orthopedic Surgery

## 2023-02-23 ENCOUNTER — Other Ambulatory Visit: Payer: Self-pay

## 2023-03-01 NOTE — Anesthesia Preprocedure Evaluation (Signed)
Anesthesia Evaluation  Patient identified by MRN, date of birth, ID band Patient awake    Reviewed: Allergy & Precautions, NPO status , Patient's Chart, lab work & pertinent test results  Airway Mallampati: II  TM Distance: >3 FB Neck ROM: Full    Dental no notable dental hx. (+) Teeth Intact, Dental Advisory Given   Pulmonary    Pulmonary exam normal breath sounds clear to auscultation       Cardiovascular hypertension, Normal cardiovascular exam Rhythm:Regular Rate:Normal     Neuro/Psych    GI/Hepatic   Endo/Other    Renal/GU      Musculoskeletal   Abdominal   Peds  Hematology   Anesthesia Other Findings   Reproductive/Obstetrics                             Anesthesia Physical Anesthesia Plan  ASA: 2  Anesthesia Plan: Regional   Post-op Pain Management: Regional block* and Tylenol PO (pre-op)*   Induction:   PONV Risk Score and Plan: 2 and Treatment may vary due to age or medical condition, Midazolam and Propofol infusion  Airway Management Planned: Natural Airway and Nasal Cannula  Additional Equipment: None  Intra-op Plan:   Post-operative Plan:   Informed Consent: I have reviewed the patients History and Physical, chart, labs and discussed the procedure including the risks, benefits and alternatives for the proposed anesthesia with the patient or authorized representative who has indicated his/her understanding and acceptance.     Dental advisory given  Plan Discussed with: CRNA and Surgeon  Anesthesia Plan Comments: (L Supraclavicular block)       Anesthesia Quick Evaluation

## 2023-03-02 ENCOUNTER — Ambulatory Visit (HOSPITAL_BASED_OUTPATIENT_CLINIC_OR_DEPARTMENT_OTHER): Payer: Medicaid Other | Admitting: Anesthesiology

## 2023-03-02 ENCOUNTER — Encounter (HOSPITAL_BASED_OUTPATIENT_CLINIC_OR_DEPARTMENT_OTHER)
Admission: RE | Disposition: A | Discharge status: Home / Self Care | Payer: Self-pay | Source: Home / Self Care | Attending: Orthopedic Surgery

## 2023-03-02 ENCOUNTER — Other Ambulatory Visit: Payer: Self-pay

## 2023-03-02 ENCOUNTER — Ambulatory Visit (HOSPITAL_BASED_OUTPATIENT_CLINIC_OR_DEPARTMENT_OTHER): Payer: Medicaid Other

## 2023-03-02 ENCOUNTER — Ambulatory Visit (HOSPITAL_BASED_OUTPATIENT_CLINIC_OR_DEPARTMENT_OTHER): Payer: Self-pay | Admitting: Anesthesiology

## 2023-03-02 ENCOUNTER — Ambulatory Visit (HOSPITAL_BASED_OUTPATIENT_CLINIC_OR_DEPARTMENT_OTHER)
Admission: RE | Admit: 2023-03-02 | Discharge: 2023-03-02 | Disposition: A | Discharge status: Home / Self Care | Payer: Medicaid Other | Attending: Orthopedic Surgery | Admitting: Orthopedic Surgery

## 2023-03-02 ENCOUNTER — Encounter (HOSPITAL_BASED_OUTPATIENT_CLINIC_OR_DEPARTMENT_OTHER): Payer: Self-pay | Admitting: Orthopedic Surgery

## 2023-03-02 DIAGNOSIS — W1830XA Fall on same level, unspecified, initial encounter: Secondary | ICD-10-CM | POA: Diagnosis not present

## 2023-03-02 DIAGNOSIS — Z01818 Encounter for other preprocedural examination: Secondary | ICD-10-CM

## 2023-03-02 DIAGNOSIS — S52502A Unspecified fracture of the lower end of left radius, initial encounter for closed fracture: Secondary | ICD-10-CM | POA: Diagnosis not present

## 2023-03-02 DIAGNOSIS — S52552A Other extraarticular fracture of lower end of left radius, initial encounter for closed fracture: Secondary | ICD-10-CM | POA: Diagnosis present

## 2023-03-02 HISTORY — PX: OPEN REDUCTION INTERNAL FIXATION (ORIF) DISTAL RADIAL FRACTURE: SHX5989

## 2023-03-02 SURGERY — OPEN REDUCTION INTERNAL FIXATION (ORIF) DISTAL RADIUS FRACTURE
Anesthesia: Regional | Site: Hand | Laterality: Left

## 2023-03-02 MED ORDER — ONDANSETRON HCL 4 MG/2ML IJ SOLN
INTRAMUSCULAR | Status: DC | PRN
  Administered 2023-03-02: 4 mg via INTRAVENOUS

## 2023-03-02 MED ORDER — KETOROLAC TROMETHAMINE 30 MG/ML IJ SOLN: 30.0000 mg | Freq: Once | INTRAMUSCULAR | Status: DC | PRN

## 2023-03-02 MED ORDER — HYDROMORPHONE HCL 1 MG/ML IJ SOLN: 0.2500 mg | INTRAMUSCULAR | Status: DC | PRN

## 2023-03-02 MED ORDER — LACTATED RINGERS IV SOLN: INTRAVENOUS | Status: DC

## 2023-03-02 MED ORDER — FENTANYL CITRATE (PF) 100 MCG/2ML IJ SOLN
INTRAMUSCULAR | Status: DC | PRN
  Administered 2023-03-02: 50 ug via INTRAVENOUS

## 2023-03-02 MED ORDER — CEFAZOLIN SODIUM-DEXTROSE 2-4 GM/100ML-% IV SOLN
2.0000 g | INTRAVENOUS | Status: AC
  Administered 2023-03-02: 2 g via INTRAVENOUS

## 2023-03-02 MED ORDER — CEFAZOLIN SODIUM-DEXTROSE 2-4 GM/100ML-% IV SOLN
INTRAVENOUS | Status: AC
  Filled 2023-03-02: qty 100

## 2023-03-02 MED ORDER — EPHEDRINE SULFATE (PRESSORS) 50 MG/ML IJ SOLN
INTRAMUSCULAR | Status: DC | PRN
Start: 2023-03-02 — End: 2023-03-02
  Administered 2023-03-02 (×2): 10 mg via INTRAVENOUS

## 2023-03-02 MED ORDER — OXYCODONE HCL 5 MG/5ML PO SOLN: 5.0000 mg | Freq: Once | ORAL | Status: DC | PRN

## 2023-03-02 MED ORDER — BUPIVACAINE HCL (PF) 0.5 % IJ SOLN
INTRAMUSCULAR | Status: DC | PRN
  Administered 2023-03-02: 30 mL via PERINEURAL

## 2023-03-02 MED ORDER — MIDAZOLAM HCL 2 MG/2ML IJ SOLN
INTRAMUSCULAR | Status: AC
  Filled 2023-03-02: qty 2

## 2023-03-02 MED ORDER — OXYCODONE HCL 5 MG PO TABS: 5.0000 mg | ORAL_TABLET | Freq: Once | ORAL | Status: DC | PRN

## 2023-03-02 MED ORDER — OXYCODONE HCL 5 MG PO TABS: 5.0000 mg | ORAL_TABLET | Freq: Four times a day (QID) | ORAL | 0 refills | Status: AC | PRN

## 2023-03-02 MED ORDER — FENTANYL CITRATE (PF) 100 MCG/2ML IJ SOLN
INTRAMUSCULAR | Status: AC
Start: 2023-03-02 — End: ?
  Filled 2023-03-02: qty 2

## 2023-03-02 MED ORDER — PROPOFOL 500 MG/50ML IV EMUL
INTRAVENOUS | Status: DC | PRN
  Administered 2023-03-02: 100 ug/kg/min via INTRAVENOUS

## 2023-03-02 MED ORDER — MIDAZOLAM HCL 2 MG/2ML IJ SOLN
2.0000 mg | Freq: Once | INTRAMUSCULAR | Status: AC
Start: 2023-03-02 — End: 2023-03-02
  Administered 2023-03-02: 2 mg via INTRAVENOUS

## 2023-03-02 MED ORDER — MIDAZOLAM HCL 2 MG/2ML IJ SOLN
INTRAMUSCULAR | Status: DC | PRN
  Administered 2023-03-02: 1 mg via INTRAVENOUS

## 2023-03-02 MED ORDER — FENTANYL CITRATE (PF) 100 MCG/2ML IJ SOLN
INTRAMUSCULAR | Status: AC
  Filled 2023-03-02: qty 2

## 2023-03-02 MED ORDER — ONDANSETRON HCL 4 MG/2ML IJ SOLN: 4.0000 mg | Freq: Once | INTRAMUSCULAR | Status: DC | PRN

## 2023-03-02 MED ORDER — 0.9 % SODIUM CHLORIDE (POUR BTL) OPTIME
TOPICAL | Status: DC | PRN
  Administered 2023-03-02: 1000 mL

## 2023-03-02 MED ORDER — FENTANYL CITRATE (PF) 100 MCG/2ML IJ SOLN
100.0000 ug | Freq: Once | INTRAMUSCULAR | Status: AC
  Administered 2023-03-02: 50 ug via INTRAVENOUS

## 2023-03-02 SURGICAL SUPPLY — 49 items
BIT DRILL SOLID 2.0X40MM (BIT) IMPLANT
BIT DRILL SOLID 2.5X40MM (BIT) IMPLANT
BLADE SURG 15 STRL LF DISP TIS (BLADE) ×1 IMPLANT
BNDG ELASTIC 3INX 5YD STR LF (GAUZE/BANDAGES/DRESSINGS) ×1 IMPLANT
BNDG ELASTIC 4INX 5YD STR LF (GAUZE/BANDAGES/DRESSINGS) IMPLANT
BNDG ESMARK 4X9 LF (GAUZE/BANDAGES/DRESSINGS) ×1 IMPLANT
BNDG GAUZE DERMACEA FLUFF 4 (GAUZE/BANDAGES/DRESSINGS) ×1 IMPLANT
BNDG PLASTER X FAST 3X3 WHT LF (CAST SUPPLIES) ×10 IMPLANT
CHLORAPREP W/TINT 26 (MISCELLANEOUS) ×1 IMPLANT
CORD BIPOLAR FORCEPS 12FT (ELECTRODE) ×1 IMPLANT
COVER BACK TABLE 60X90IN (DRAPES) ×1 IMPLANT
CUFF TOURN SGL QUICK 18X4 (TOURNIQUET CUFF) ×1 IMPLANT
CUFF TRNQT CYL 24X4X16.5-23 (TOURNIQUET CUFF) IMPLANT
DRAPE EXTREMITY T 121X128X90 (DISPOSABLE) ×1 IMPLANT
DRAPE OEC MINIVIEW 54X84 (DRAPES) ×1 IMPLANT
DRAPE SURG 17X23 STRL (DRAPES) ×2 IMPLANT
DRILL SOLID 2.0X40MM (BIT) ×1 IMPLANT
DRILL SOLID 2.5X40MM (BIT) ×1 IMPLANT
GAUZE SPONGE 4X4 12PLY STRL (GAUZE/BANDAGES/DRESSINGS) ×1 IMPLANT
GAUZE XEROFORM 1X8 LF (GAUZE/BANDAGES/DRESSINGS) IMPLANT
GLOVE BIO SURGEON STRL SZ7 (GLOVE) ×1 IMPLANT
GOWN STRL REUS W/ TWL LRG LVL3 (GOWN DISPOSABLE) ×2 IMPLANT
GUIDE AIMING 1.5MM (WIRE) IMPLANT
NDL HYPO 25X1 1.5 SAFETY (NEEDLE) IMPLANT
NEEDLE HYPO 25X1 1.5 SAFETY (NEEDLE) IMPLANT
NS IRRIG 1000ML POUR BTL (IV SOLUTION) ×1 IMPLANT
PACK BASIN DAY SURGERY FS (CUSTOM PROCEDURE TRAY) ×1 IMPLANT
PAD CAST 3X4 CTTN HI CHSV (CAST SUPPLIES) ×1 IMPLANT
PEG GEMINUS SMOOTH LOCK 2.0X17 (Peg) IMPLANT
PEG GEMINUS SMOOTH LOCK 2.0X19 (Peg) IMPLANT
PEG GEMINUS THRD TPNL 2.7X20 (Peg) IMPLANT
PEG SMOOTH LOCK 2.0X18 (Screw) IMPLANT
PLATE STD 3H LEFT (Plate) IMPLANT
SCREW NONLOCK POLYAXIAL 3.5X11 (Screw) IMPLANT
SCREW POLY NON LOCK 3.5MMX12MM (Screw) IMPLANT
SCREWDRIVER SURG ST 2 (INSTRUMENTS) IMPLANT
SHEET MEDIUM DRAPE 40X70 STRL (DRAPES) ×1 IMPLANT
SLEEVE SCD COMPRESS KNEE MED (STOCKING) IMPLANT
SLING ARM FOAM STRAP LRG (SOFTGOODS) IMPLANT
SPLINT FIBERGLASS 4X30 (CAST SUPPLIES) IMPLANT
SUT ETHILON 4 0 PS 2 18 (SUTURE) ×1 IMPLANT
SUT MNCRL AB 3-0 PS2 18 (SUTURE) ×1 IMPLANT
SUT VIC AB 4-0 PS2 18 (SUTURE) IMPLANT
SYR BULB EAR ULCER 3OZ GRN STR (SYRINGE) ×1 IMPLANT
SYR CONTROL 10ML LL (SYRINGE) IMPLANT
TOWEL GREEN STERILE FF (TOWEL DISPOSABLE) ×2 IMPLANT
UNDERPAD 30X36 HEAVY ABSORB (UNDERPADS AND DIAPERS) ×1 IMPLANT
WIRE FIX 1.5 STANDARD TIP (WIRE) ×2 IMPLANT
WIRE FIX 1.5 STD TIP (WIRE) IMPLANT

## 2023-03-02 NOTE — Interval H&P Note (Signed)
History and Physical Interval Note:  03/02/2023 9:50 AM  Shari Gonzales  has presented today for surgery, with the diagnosis of Left distal radius fracture.  The various methods of treatment have been discussed with the patient and family. After consideration of risks, benefits and other options for treatment, the patient has consented to  Procedure(s) with comments: OPEN REDUCTION INTERNAL FIXATION (ORIF) DISTAL RADIUS FRACTURE (Left) - regional block as a surgical intervention.  The patient's history has been reviewed, patient examined, no change in status, stable for surgery.  I have reviewed the patient's chart and labs.  Questions were answered to the patient's satisfaction.     Song Garris

## 2023-03-02 NOTE — Progress Notes (Signed)
Assisted Dr. Richardson Landry with left, supraclavicular, ultrasound guided block. Side rails up, monitors on throughout procedure. See vital signs in flow sheet. Tolerated Procedure well.

## 2023-03-02 NOTE — Discharge Instructions (Addendum)
Shari Gonzales, M.D. Hand Surgery  POST-OPERATIVE DISCHARGE INSTRUCTIONS   PRESCRIPTIONS: You may have been given a prescription to be taken as directed for post-operative pain control.  You may also take over the counter ibuprofen/aleve and tylenol for pain. Take this as directed on the packaging. Do not exceed 3000 mg tylenol/acetaminophen in 24 hours.  Ibuprofen 600-800 mg (3-4) tablets by mouth every 6 hours as needed for pain.  OR Aleve 2 tablets by mouth every 12 hours (twice daily) as needed for pain.  AND/OR Tylenol 1000 mg (2 tablets) every 8 hours as needed for pain.  Please use your pain medication carefully, as refills are limited and you may not be provided with one.  As stated above, please use over the counter pain medicine - it will also be helpful with decreasing your swelling.    ANESTHESIA: After your surgery, post-surgical discomfort or pain is likely. This discomfort can last several days to a few weeks. At certain times of the day your discomfort may be more intense.   Did you receive a nerve block?  A nerve block can provide pain relief for one hour to two days after your surgery. As long as the nerve block is working, you will experience little or no sensation in the area the surgeon operated on.  As the nerve block wears off, you will begin to experience pain or discomfort. It is very important that you begin taking your prescribed pain medication before the nerve block fully wears off. Treating your pain at the first sign of the block wearing off will ensure your pain is better controlled and more tolerable when full-sensation returns. Do not wait until the pain is intolerable, as the medicine will be less effective. It is better to treat pain in advance than to try and catch up.   General Anesthesia:  If you did not receive a nerve block during your surgery, you will need to start taking your pain medication shortly after your surgery and should continue  to do so as prescribed by your surgeon.     ICE AND ELEVATION: You may use ice for the first 48-72 hours, but it is not critical.   Motion of your fingers is very important to decrease the swelling.  Elevation, as much as possible for the next 48 hours, is critical for decreasing swelling as well as for pain relief. Elevation means when you are seated or lying down, you hand should be at or above your heart. When walking, the hand needs to be at or above the level of your elbow.  If the bandage gets too tight, it may need to be loosened. Please contact our office and we will instruct you in how to do this.    SURGICAL BANDAGES:  Keep your dressing and/or splint clean and dry at all times.  Do not remove until you are seen again in the office.  If careful, you may place a plastic bag over your bandage and tape the end to shower, but be careful, do not get your bandages wet.     HAND THERAPY:  You will be contacted to set up your first therapy visit.    ACTIVITY AND WORK: You are encouraged to move any fingers which are not in the bandage.  Light use of the fingers is allowed to assist the other hand with daily hygiene and eating, but strong gripping or lifting is often uncomfortable and should be avoided.  You might miss a variable  period of time from work and hopefully this issue has been discussed prior to surgery. You may not do any heavy work with your affected hand for about 2 weeks.    [facility] Second Floor, [address] [place], [place] [address] [phone]

## 2023-03-02 NOTE — Transfer of Care (Signed)
Immediate Anesthesia Transfer of Care Note  Patient: Shari Gonzales  Procedure(s) Performed: OPEN REDUCTION INTERNAL FIXATION (ORIF) DISTAL RADIUS FRACTURE (Left: Hand)  Patient Location: PACU  Anesthesia Type:MAC and Regional  Level of Consciousness: awake, alert , and oriented  Airway & Oxygen Therapy: Patient Spontanous Breathing and Patient connected to face mask oxygen  Post-op Assessment: Report given to RN and Post -op Vital signs reviewed and stable  Post vital signs: Reviewed and stable  Last Vitals:  Vitals Value Taken Time  BP    Temp    Pulse    Resp    SpO2      Last Pain:  Vitals:   03/02/23 0957  TempSrc: Temporal  PainSc: 4       Patients Stated Pain Goal: 5 (03/02/23 0957)  Complications: No notable events documented.

## 2023-03-02 NOTE — Anesthesia Procedure Notes (Signed)
Anesthesia Regional Block: Supraclavicular block   Pre-Anesthetic Checklist: , timeout performed,  Correct Patient, Correct Site, Correct Laterality,  Correct Procedure, Correct Position, site marked,  Risks and benefits discussed,  Surgical consent,  Pre-op evaluation,  At surgeon's request and post-op pain management  Laterality: Upper and Left  Prep: chloraprep       Needles:  Injection technique: Single-shot  Needle Type: Echogenic Needle     Needle Length: 5cm  Needle Gauge: 21     Additional Needles:   Procedures:,,,, ultrasound used (permanent image in chart),,     Nerve Stimulator or Paresthesia:   Additional Responses:  Block tested.  Patient tolerated procedure well Narrative:  Start time: 03/02/2023 10:45 AM End time: 03/02/2023 10:51 AM Injection made incrementally with aspirations every 5 mL.  Performed by: Personally  Anesthesiologist: [name], MD  Additional Notes: Block tested. Patient tolerated procedure well.

## 2023-03-02 NOTE — Op Note (Signed)
Date of Surgery: 03/02/2023  INDICATIONS: Patient is a 63 y.o.-year-old female with a closed, left displaced, extra-articular distal radius fracture.  Risks, benefits, and alternatives to surgery were again discussed with the patient in the preoperative area. The patient wishes to proceed with surgery.  Informed consent was signed after our discussion.   PREOPERATIVE DIAGNOSIS:  Closed, left, extra-articular distal radius fracture  POSTOPERATIVE DIAGNOSIS: Same.  PROCEDURE: ORIF left distal radius fracture, extra-articular Left brachioradialis tenotomy   SURGEON: Waylan Rocher, M.D.  ASSIST: None  ANESTHESIA:  Regional, MAC  IV FLUIDS AND URINE: See anesthesia.  ESTIMATED BLOOD LOSS: 10 mL.  IMPLANTS: * No implants in log *   DRAINS: None  COMPLICATIONS: None  DESCRIPTION OF PROCEDURE:   The patient was met in the preoperative holding area where the surgical site was marked and the consent form was signed. The patient was brought to the operating room and remained on the stretcher.  A hand table was placed adjacent to the operative extremity and locked into place.  All bony prominences were well-padded.  Monitored anesthesia was induced.  A tourniquet was placed high on the left upper arm.  The left upper extremity was then prepped and draped in the usual and sterile fashion.  A formal timeout was performed to confirm that this is the correct patient, surgical side, surgical site, and surgical procedure.  All necessary implants were in the room.  Following formal timeout, a longitudinal incision was made directly overlying the flexor carpi radialis tendon.  The skin was incised.  Small crossing vessels were coagulated with bipolar cautery.  The volar aspect of the flexor carpi radialis tendon sheath was incised longitudinally.  The FCR tendon was retracted radially.  The floor of the FCR tendon sheath was similarly divided using a 15 blade scalpel.  Blunt dissection was used  to develop Parona space between the flexor pollicis longus and pronator quadratus.  The flexor pollicis longus was retracted ulnarly.  An L-shaped tenotomy was performed along the distal and radial aspect of the pronator quadratus tendon.  The pronator quadratus was subperiosteally dissected off of the volar aspect of the radius and the distal fracture fragments using an elevator.  Blunt dissection was used to identify the brachioradialis tendon as it inserted on the radial aspect of the distal radius.  The underlying first dorsal compartment tendons were identified and protected.  A tenotomy of the brachioradialis was performed to aid in reduction of the fracture.  The fracture site was identified.  There was some early callus formation.  A lobster claw was placed around the radial shaft and the proximal segment was pronated out of the wound.  This allowed access to the dorsum of the distal radius.  There was abundant bony callus formation and thickened periosteal tissue.  This was debrided using a freer elevator and a periosteal elevator.  The fracture site itself was debrided of early callus using a curette and rongeur.  The fracture was then reduced.  A 3 hole standard plate was chosen.  Was placed on the volar surface of the radius.  The plate was fixed with a bicortical nonlocking screw in the center of the oblong hole.  2 K wires were placed in the aiming guides of the plate.  A 30 degree lateral view showed that the screws would be in a good, subchondral position.  The fracture was appropriate reduced on all views.  I then placed a bicortical nonlocking screw in both the radial and ulnar  heads of the plate to compress the plate down to the volar surface of the distal fragment.  I then filled the distal holes in the plate with smooth locking pegs taking care that the pegs did not penetrate the dorsal cortex.  The previously placed bicortical screws were removed and placed with unicortical smooth locking pegs.   The proximal holes in the plate were then filled with bicortical nonlocking screws with excellent purchase.  AP, lateral, and 30 degree lateral view showed that the fracture was appropriate reduced with restoration of radial height and volar tilt.  The screws were in a subchondral location and were of the appropriate length.  A skyline view of the wrist showed that the screws were of appropriate length.  At this point, the wound was thoroughly irrigated with copious sterile saline.  The pronator was partially repaired using a 4-0 Monocryl suture.  The skin was closed using several 4-0 Monocryl sutures in a buried interrupted fashion.  The tourniquet was deflated.  Hemostasis was achieved with direct pressure over the wound.  The skin was then closed using a 4-0 nylon suture in horizontal mattress fashion.  The DRUJ was examined and found to be stable in neutral, supination, and pronation.  The wound was then cleaned and dressed with Xeroform, folded Kerlix, cast padding, and a well-padded volar splint was applied.   The patient was reversed from sedation.  All counts were correct x 2 at the end of the procedure.  The patient was then taken to the PACU in stable condition.   POSTOPERATIVE PLAN: She will be discharged home with appropriate pain medication and discharge instructions.  A referral will be made to hand therapy.  I will see her back in 10 to 14 days for her first postop visit.  Waylan Rocher, MD 2:16 PM

## 2023-03-02 NOTE — Anesthesia Postprocedure Evaluation (Signed)
Anesthesia Post Note  Patient: Shari Gonzales  Procedure(s) Performed: OPEN REDUCTION INTERNAL FIXATION (ORIF) DISTAL RADIUS FRACTURE (Left: Hand)     Patient location during evaluation: PACU Anesthesia Type: Regional Level of consciousness: awake and alert Pain management: pain level controlled Vital Signs Assessment: post-procedure vital signs reviewed and stable Respiratory status: spontaneous breathing, nonlabored ventilation, respiratory function stable and patient connected to nasal cannula oxygen Cardiovascular status: stable and blood pressure returned to baseline Postop Assessment: no apparent nausea or vomiting Anesthetic complications: no  No notable events documented.  Last Vitals:  Vitals:   03/02/23 1342 03/02/23 1345  BP: (!) 149/97 (!) 147/90  Pulse: 82 78  Resp:  20  Temp: (!) 36.3 C   SpO2: 94% 94%    Last Pain:  Vitals:   03/02/23 1342  TempSrc:   PainSc: 0-No pain                 [name]

## 2023-03-02 NOTE — H&P (Signed)
HAND SURGERY   HPI: Patient is a 63 y.o. female who presents with a closed, left, distal radius fracture after a ground-level fall.  She was seen in the office where we reviewed the nature of her injury and discussed both conservative and surgical treatment options..  She elected to pursue surgical management of her fracture.  Patient denies any changes to their medical history or new systemic symptoms today.    Past Medical History:  Diagnosis Date   Hyperkalemia    Hyperlipidemia    Hypertension    no meds, ran out   Seizures (HCC)    last ? sz 2023, EEG did not show any epileptical discharges, CT was normal   Vitamin D deficiency    Past Surgical History:  Procedure Laterality Date   COLONOSCOPY     Social History   Socioeconomic History   Marital status: Single    Spouse name: Not on file   Number of children: Not on file   Years of education: Not on file   Highest education level: Not on file  Occupational History   Not on file  Tobacco Use   Smoking status: Never   Smokeless tobacco: Never  Vaping Use   Vaping status: Never Used  Substance and Sexual Activity   Alcohol use: Yes    Comment: binge drinks beer   Drug use: Never   Sexual activity: Yes    Birth control/protection: Post-menopausal  Other Topics Concern   Not on file  Social History Narrative   Not on file   Social Drivers of Health   Financial Resource Strain: Not on file  Food Insecurity: Not on file  Transportation Needs: Not on file  Physical Activity: Not on file  Stress: Not on file  Social Connections: Not on file   Family History  Family history unknown: Yes   - negative except otherwise stated in the family history section No Known Allergies Prior to Admission medications   Medication Sig Start Date End Date Taking? Authorizing Provider  acetaminophen (TYLENOL) 500 MG tablet Take 500 mg by mouth every 6 (six) hours as needed for mild pain (pain score 1-3).   Yes [provider]  b complex vitamins capsule Take 1 capsule by mouth daily.   Yes [provider]  oxyCODONE-acetaminophen (PERCOCET/ROXICET) 5-325 MG tablet Take 1 tablet by mouth every 6 (six) hours as needed for severe pain (pain score 7-10). 02/09/23  Yes Tilden Fossa, MD   No results found. - Positive ROS: All other systems have been reviewed and were otherwise negative with the exception of those mentioned in the HPI and as above.  Physical Exam: General: No acute distress, resting comfortably Cardiovascular: BUE warm and well perfused, normal rate Respiratory: Normal WOB on RA Skin: Warm and dry Neurologic: Sensation intact distally Psychiatric: Patient is at baseline mood and affect  Left upper Extremity  Sugar-tong splint is clean and dry.  She has limited active range of motion of her finger secondary to swelling and discomfort.  Sensation is intact light touch in the median, ulnar, radial nerve distributions.  Her hand is warm and well-perfused with brisk capillary refill.  Assessment: 62 year old female with a noted, displaced, angulated fracture of the distal radius after a ground-level fall.  Plan: OR today for open reduction and internal fixation of the left distal radius. We again reviewed the risks of surgery which include bleeding, infection, damage to neurovascular structures, persistent symptoms, nonunion, malunion, symptomatic hardware, wrist stiffness, delayed wound  healing, need for additional surgery.  Informed consent was signed.  All questions were answered.   Marlyne Beards, M.D. [facility] 9:48 AM

## 2023-03-03 ENCOUNTER — Encounter (HOSPITAL_BASED_OUTPATIENT_CLINIC_OR_DEPARTMENT_OTHER): Payer: Self-pay | Admitting: Orthopedic Surgery

## 2023-03-11 ENCOUNTER — Encounter (HOSPITAL_COMMUNITY): Payer: Self-pay

## 2023-03-11 ENCOUNTER — Emergency Department (HOSPITAL_COMMUNITY)
Admission: EM | Admit: 2023-03-11 | Discharge: 2023-03-11 | Disposition: A | Discharge status: Home / Self Care | Payer: Medicaid Other | Attending: Emergency Medicine | Admitting: Emergency Medicine

## 2023-03-11 ENCOUNTER — Other Ambulatory Visit: Payer: Self-pay

## 2023-03-11 DIAGNOSIS — I10 Essential (primary) hypertension: Secondary | ICD-10-CM | POA: Insufficient documentation

## 2023-03-11 DIAGNOSIS — Z59819 Housing instability, housed unspecified: Secondary | ICD-10-CM

## 2023-03-11 DIAGNOSIS — R5383 Other fatigue: Secondary | ICD-10-CM | POA: Diagnosis not present

## 2023-03-11 DIAGNOSIS — X31XXXA Exposure to excessive natural cold, initial encounter: Secondary | ICD-10-CM | POA: Diagnosis not present

## 2023-03-11 DIAGNOSIS — T699XXA Effect of reduced temperature, unspecified, initial encounter: Secondary | ICD-10-CM

## 2023-03-11 NOTE — Discharge Instructions (Signed)
 [facility] @ [address] [place] - opens at [employer] through 8am Saturday 2/22 [facility] @ [address] - opens at [employer] through 6:30am Saturday 2/22
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
## 2023-03-11 NOTE — Progress Notes (Signed)
 CSW added shelter resources to patient's AVS.  Edwin Dada, MSW, LCSW Transitions of Care  Clinical Social Worker II [phone]
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
## 2023-03-11 NOTE — ED Notes (Signed)
Declined last set of vitals

## 2023-03-18 ENCOUNTER — Ambulatory Visit (HOSPITAL_BASED_OUTPATIENT_CLINIC_OR_DEPARTMENT_OTHER): Payer: Medicaid Other | Admitting: Orthopaedic Surgery

## 2023-03-18 ENCOUNTER — Ambulatory Visit (HOSPITAL_BASED_OUTPATIENT_CLINIC_OR_DEPARTMENT_OTHER): Payer: Medicaid Other

## 2023-03-18 DIAGNOSIS — M25511 Pain in right shoulder: Secondary | ICD-10-CM

## 2023-03-18 MED ORDER — LIDOCAINE HCL 1 % IJ SOLN
4.0000 mL | INTRAMUSCULAR | Status: AC | PRN
  Administered 2023-03-18: 4 mL

## 2023-03-18 MED ORDER — TRIAMCINOLONE ACETONIDE 40 MG/ML IJ SUSP
80.0000 mg | INTRAMUSCULAR | Status: AC | PRN
  Administered 2023-03-18: 80 mg via INTRA_ARTICULAR

## 2023-03-18 NOTE — Progress Notes (Signed)
 Chief Complaint: right shoulder pain, dislocation     History of Present Illness:   03/18/2023: Shari Gonzales presents today for follow-up of her right shoulder.  This time she is still having persistent pain down the lateral aspect of the shoulder.  She did have a previous difficult time obtaining insurance coverage for right shoulder surgery.  Shari Gonzales is a 63 y.o. female dominant female presents after right shoulder dislocation that occurred on January 03, 2021.  She had to go to the emergency room to have this relocated.  This dislocated when she was pulling a heavy load of laundry at work.  Denies any previous issues of instability or dislocation.  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  At this time she is here for further evaluation    Surgical History:   None  PMH/PSH/Family History/Social History/Meds/Allergies:    Past Medical History:  Diagnosis Date  . Hyperkalemia   . Hyperlipidemia   . Hypertension    no meds, ran out  . Seizures (HCC)    last ? sz 2023, EEG did not show any epileptical discharges, CT was normal  . Vitamin D deficiency    Past Surgical History:  Procedure Laterality Date  . COLONOSCOPY    . OPEN REDUCTION INTERNAL FIXATION (ORIF) DISTAL RADIAL FRACTURE Left 03/02/2023   Procedure: OPEN REDUCTION INTERNAL FIXATION (ORIF) DISTAL RADIUS FRACTURE;  Surgeon: Marlyne Beards, MD;  Location: [facility];  Service: Orthopedics;  Laterality: Left;  regional block   Social History   Socioeconomic History  . Marital status: Single    Spouse name: Not on file  . Number of children: Not on file  . Years of education: Not on file  . Highest education level: Not on file  Occupational History  . Not on file  Tobacco Use  . Smoking status: Never  . Smokeless tobacco: Never  Vaping Use  . Vaping  status: Never Used  Substance and Sexual Activity  . Alcohol use: Yes    Comment: binge drinks beer  . Drug use: Never  . Sexual activity: Yes    Birth control/protection: Post-menopausal  Other Topics Concern  . Not on file  Social History Narrative  . Not on file   Social Drivers of Health   Financial Resource Strain: Not on file  Food Insecurity: Not on file  Transportation Needs: Not on file  Physical Activity: Not on file  Stress: Not on file  Social Connections: Not on file   Family History  Family history unknown: Yes   No Known Allergies Current Outpatient Medications  Medication Sig Dispense Refill  . acetaminophen (TYLENOL) 500 MG tablet Take 500 mg by mouth every 6 (six) hours as needed for mild pain (pain score 1-3).    Marland Kitchen b complex vitamins capsule Take 1 capsule by mouth daily.     No current facility-administered medications for this visit.   No results found.  Review of Systems:   A ROS was performed including pertinent positives and negatives as documented in the HPI.  Physical Exam :   Constitutional: NAD and appears stated age Neurological: Alert and oriented Psych: Appropriate affect and cooperative There were no vitals  taken for this visit.   Comprehensive Musculoskeletal Exam:    Musculoskeletal Exam    Inspection Right Left  Skin No atrophy or winging No atrophy or winging  Palpation    Tenderness Lateral deltoid, bicipital groove none  Range of Motion    Flexion (passive) 170 170  Flexion (active) 170 170  Abduction 170 170  ER at the side 65 70  Can reach behind back to T12 T12  Strength     4/5 with pain 5 out of 5  Special Tests    Pseudoparalytic No No  Neurologic    Fires PIN, radial, median, ulnar, musculocutaneous, axillary, suprascapular, long thoracic, and spinal accessory innervated muscles. No abnormal sensibility  Vascular/Lymphatic    Radial Pulse 2+ 2+  Cervical Exam    Patient has symmetric cervical range of motion  with negative Spurling's test.  Special Test: Positive Spurling's test, positive speeds test with tenderness over the bicipital groove     Imaging:   Xray (3 views right shoulder): Concentrically reduced right shoulder  MRI right shoulder: She has overall a very minor partial rotator cuff tear, this involves approximately 50% of the articular footprint of the supraspinatus.  There appears to be some tearing of the anterior labrum.  Glenohumeral cartilage is preserved.    I personally reviewed and interpreted the radiographs.   Assessment:   63 year old female status post right shoulder dislocation which was subsequently reduced in the emergency room we did discuss that with a shoulder dislocation.  At today's visit she is having persistent shoulder pain.  She would like to proceed with an ultrasound-guided injection of the right shoulder.  I will plan to see her back in 3 months for reassessment Plan :    -Return to clinic 3 months for reassessment    Procedure Note  Patient: Shari Gonzales             Date of Birth: October 23, 1960           MRN: [id number]             Visit Date: 03/18/2023  Procedures: Visit Diagnoses:  1. Right shoulder pain, unspecified chronicity     Large Joint Inj: R subacromial bursa on 03/18/2023 2:39 PM Indications: pain Details: 22 G 1.5 in needle, ultrasound-guided anterior approach  Arthrogram: No  Medications: 4 mL lidocaine 1 %; 80 mg triamcinolone acetonide 40 MG/ML Outcome: tolerated well, no immediate complications Procedure, treatment alternatives, risks and benefits explained, specific risks discussed. Consent was given by the patient. Immediately prior to procedure a time out was called to verify the correct patient, procedure, equipment, support staff and site/side marked as required. Patient was prepped and draped in the usual sterile fashion.          I personally saw and evaluated the patient, and participated in the management  and treatment plan.  Huel Cote, MD Attending Physician, Orthopedic Surgery  This document was dictated using Dragon voice recognition software. A reasonable attempt at proof reading has been made to minimize errors.

## 2023-06-15 ENCOUNTER — Ambulatory Visit (HOSPITAL_BASED_OUTPATIENT_CLINIC_OR_DEPARTMENT_OTHER): Payer: Medicaid Other | Admitting: Orthopaedic Surgery

## 2023-07-11 ENCOUNTER — Emergency Department (HOSPITAL_COMMUNITY)
Admission: EM | Admit: 2023-07-11 | Discharge: 2023-07-11 | Disposition: A | Discharge status: Home / Self Care | Attending: Emergency Medicine | Admitting: Emergency Medicine

## 2023-07-11 DIAGNOSIS — F1010 Alcohol abuse, uncomplicated: Secondary | ICD-10-CM | POA: Insufficient documentation

## 2023-07-11 DIAGNOSIS — I10 Essential (primary) hypertension: Secondary | ICD-10-CM | POA: Insufficient documentation

## 2023-07-11 DIAGNOSIS — F444 Conversion disorder with motor symptom or deficit: Secondary | ICD-10-CM | POA: Insufficient documentation

## 2023-07-11 DIAGNOSIS — F1090 Alcohol use, unspecified, uncomplicated: Secondary | ICD-10-CM

## 2023-07-11 DIAGNOSIS — R251 Tremor, unspecified: Secondary | ICD-10-CM | POA: Diagnosis present

## 2023-07-11 MED ORDER — PHENOBARBITAL SODIUM 130 MG/ML IJ SOLN
130.0000 mg | Freq: Once | INTRAMUSCULAR | Status: AC
  Administered 2023-07-11: 130 mg via INTRAMUSCULAR
  Filled 2023-07-11: qty 1
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
## 2023-07-11 NOTE — ED Triage Notes (Signed)
 Pt BIB EMS coming from hotel. Pt states she had a seizure tonight when she drank alcohol tonight. She states drinking daily. Hx: seizures. No on seizure medication.   EMS: BP 162/101, HR 81, RR 16, CBG 86, Spo2 96%

## 2023-10-23 ENCOUNTER — Other Ambulatory Visit: Payer: Self-pay

## 2023-10-23 ENCOUNTER — Encounter (HOSPITAL_COMMUNITY): Payer: Self-pay

## 2023-10-23 ENCOUNTER — Emergency Department (HOSPITAL_COMMUNITY)
Admission: EM | Admit: 2023-10-23 | Discharge: 2023-10-23 | Disposition: A | Discharge status: Home / Self Care | Attending: Emergency Medicine | Admitting: Emergency Medicine

## 2023-10-23 DIAGNOSIS — R21 Rash and other nonspecific skin eruption: Secondary | ICD-10-CM | POA: Diagnosis present

## 2023-10-23 MED ORDER — LORATADINE 10 MG PO TABS
10.0000 mg | ORAL_TABLET | Freq: Once | ORAL | Status: AC
  Administered 2023-10-23: 10 mg via ORAL
  Filled 2023-10-23: qty 1

## 2023-10-23 MED ORDER — TRIAMCINOLONE ACETONIDE 0.1 % EX CREA: 1.0000 | TOPICAL_CREAM | Freq: Two times a day (BID) | CUTANEOUS | 0 refills | Status: AC

## 2023-10-23 MED ORDER — LORATADINE 10 MG PO TABS: 10.0000 mg | ORAL_TABLET | Freq: Every day | ORAL | 0 refills | Status: AC

## 2023-10-23 MED ORDER — METHYLPREDNISOLONE 4 MG PO TBPK: ORAL_TABLET | Freq: Every day | ORAL | 0 refills | Status: AC

## 2023-10-23 MED ORDER — FAMOTIDINE 20 MG PO TABS
20.0000 mg | ORAL_TABLET | Freq: Once | ORAL | Status: AC
  Administered 2023-10-23: 20 mg via ORAL
  Filled 2023-10-23: qty 1

## 2023-10-23 MED ORDER — PREDNISONE 20 MG PO TABS
40.0000 mg | ORAL_TABLET | Freq: Once | ORAL | Status: AC
Start: 2023-10-23 — End: 2023-10-23
  Administered 2023-10-23: 40 mg via ORAL
  Filled 2023-10-23: qty 2

## 2023-10-23 NOTE — ED Notes (Signed)
 AVS with prescriptions provided to and discussed with patient. Pt verbalizes understanding of discharge instructions and denies any questions or concerns at this time. Pt ambulated out of department independently with steady gait.

## 2023-10-23 NOTE — ED Provider Notes (Signed)
 [place] EMERGENCY DEPARTMENT AT [facility] Provider Note   CSN: [id number] Arrival date & time: 10/23/23  9096     Patient presents with: Rash   Shari Gonzales is a 63 y.o. female self reportedly otherwise healthy presents to the ER today for evaluation of diffuse itching rash throughout, but mainly on the torso and chest for the past week. She reports it is itching in nature, not painful.  Denies any recent illnesses.  Denies any new medications, products, laundry detergents, soaps, lotions, foods.  She denies any chest pain, shortness of breath, trouble breathing, belly pain, nausea, vomiting.  Reports she has been trying some over-the-counter hydrocortisone cream without much relief of pain.  She denies any fevers or recent cough or cold symptoms.  She reports that currently she is taking an apartment.  She denies any daily medication or recent travel.   Rash Associated symptoms: no abdominal pain, no fever, no nausea, no shortness of breath and not vomiting        Prior to Admission medications   Medication Sig Start Date End Date Taking? Authorizing Provider  acetaminophen  (TYLENOL ) 500 MG tablet Take 500 mg by mouth every 6 (six) hours as needed for mild pain (pain score 1-3).    [provider]  b complex vitamins capsule Take 1 capsule by mouth daily.    [provider]    Allergies: Patient has no known allergies.    Review of Systems  Constitutional:  Negative for chills and fever.  HENT:  Negative for congestion, rhinorrhea and trouble swallowing.   Respiratory:  Negative for shortness of breath.   Cardiovascular:  Negative for chest pain.  Gastrointestinal:  Negative for abdominal pain, nausea and vomiting.  Skin:  Positive for rash.    Updated Vital Signs BP (!) 153/96 (BP Location: Right Arm)   Pulse 84   Temp 98.3 F (36.8 C) (Oral)   Resp 18   Ht 5' (1.524 m)   Wt 68.5 kg   SpO2 100%   BMI 29.49 kg/m   Physical  Exam Vitals and nursing note reviewed.  Constitutional:      General: She is not in acute distress.    Appearance: She is not ill-appearing or toxic-appearing.     Comments: Scratching rash  Eyes:     General: No scleral icterus. Cardiovascular:     Rate and Rhythm: Normal rate.  Pulmonary:     Effort: Pulmonary effort is normal. No respiratory distress.  Skin:    General: Skin is warm and dry.     Findings: Rash present.     Comments: Raised papular rash present to mainly the bilateral flanks, back, chest, abdomen.  Some scattered on her lower legs and arms and left side of her face.  No vesicles appreciated.  No skin sloughing or blistering noted.  Nontender to palpation.  No discharge or crusting present.  Does appear to have some dermatographism and excoriation marks. No burrowing in the hands appreciated. No petechiae.   Neurological:     Mental Status: She is alert.     (all labs ordered are listed, but only abnormal results are displayed) Labs Reviewed - No data to display  EKG: None  Radiology: No results found.  Procedures   Medications Ordered in the ED - No data to display  Medical Decision Making Risk OTC drugs. Prescription drug management.   63 y.o. female presents to the ER today for evaluation of itching rash. Differential diagnosis includes  but is not limited to SJS, TENS, contact dermatitic, allergic dermatitis, urticaria, shingles, RMSF, necrotizing fascitis. Vital signs mildly elevated BP otherwise unremarkable. Physical exam as noted above.   Please see attached image of rash.  Patient has diffuse rash but mainly present to the torso, abdomen, and chest.  Does have some the left side of her face and some scattered to her arms and bilateral lower legs.  Raised and papular.  No vesicles or crusting present.  No skin sloughing side.  She is not on any daily medications.  No known exposures.  Denies any new medications, foods, detergents, or body products.   She is not having any other anaphylactic or allergic reaction symptoms such as belly pain, nausea, vomiting, shortness of breath, trouble swallowing, chest pain, fever.  Denies any recent illnesses.  Has tried hydrocortisone cream without much relief.  She does not appear to be in any acute distress.  Rash does not appear vesicular or sick to any dermatomal pattern, doubt any shingles.  No skin sloughing or blistering present, doubt SJS or TENS.  No overlying cellulitic changes noted.  Does not appear urticarial appears more to be more dermatitis.  No crepitus.  Will give her some Pepcid here as well as prednisone .  Will send her home with a Medrol Dosepak as well as some triamcinolone  and Claritin.  Dermatology referral given as well.  Do not think this is any emergent cause of rash however gave patient return precautions.  We discussed plan at bedside. We discussed strict return precautions and red flag symptoms. The patient verbalized their understanding and agrees to the plan. The patient is stable and being discharged home in good condition.  Portions of this report may have been transcribed using voice recognition software. Every effort was made to ensure accuracy; however, inadvertent computerized transcription errors may be present.    Final diagnoses:  Rash    ED Discharge Orders          Ordered    loratadine (CLARITIN) 10 MG tablet  Daily        10/23/23 1133    methylPREDNISolone (MEDROL DOSEPAK) 4 MG TBPK tablet  Daily        10/23/23 1133    triamcinolone  cream (KENALOG ) 0.1 %  2 times daily        10/23/23 1133               Bernis Ernst, [place] 10/23/23 1404    Levander Houston, MD 10/25/23 1116

## 2023-10-23 NOTE — ED Triage Notes (Signed)
 Patient reports rash x 1 week.  Patient is covered in urticaria. Describes as itching

## 2023-10-23 NOTE — Discharge Instructions (Addendum)
 You were seen in the ER today for evaluation of your full body rash. I am prescribing you a few medications for this. One is called Claritin which you will take daily. Another is called prednisone  that you will take as prescribed. Additionally, I have prescribed you a steriod cream to use up to twice daily. Make sure that you are keeping your skin moisturized and try to stop itching. I have given you the information for a dermatologist for you to follow up with. Please call to schedule an appointment.  If you start having trouble breathing, trouble swallowing, worsening rash, blistering, chest pain, shortness of breath, please return your nearest emerged from for evaluation.  Otherwise, try to use unscented and sensitive skin products.  Try to wash your sheets in a more sensitive formula.  If you have any concerns, new or worsening symptoms, please return to your nearest emergency room for reevaluation.  Contact a health care provider if: You sweat at night more than normal. You pee (urinate) more or less than normal, or your pee is a darker color than normal. Your eyes become sensitive to light. Your skin or the white parts of your eyes turn yellow (jaundice). Your skin tingles or is numb. You get painful blisters in your nose or mouth. Your rash does not go away after a few days, or it gets worse. You are more tired or thirsty than normal. You have new or worse symptoms. These may include: Pain in your abdomen. Fever. Diarrhea or vomiting. Weakness or weight loss. Get help right away if: You get confused. You have a severe headache, a stiff neck, or severe joint pain or stiffness. You become very sleepy or not responsive. You have a seizure.

## 2023-11-21 ENCOUNTER — Encounter: Payer: Self-pay | Admitting: Radiology
